# Patient Record
Sex: Female | Born: 1950 | Race: White | Hispanic: No
Health system: Midwestern US, Academic
[De-identification: ages and names within clinical notes are randomized; demographics above are authoritative.]

---

## 2020-08-18 ENCOUNTER — Encounter: Admit: 2020-08-18 | Discharge: 2020-08-18

## 2020-08-18 NOTE — Telephone Encounter
Called and spoke w/Anita @ referring Dr. Lavone Nian office. Informed her I'm calling regarding the referral form she sent over and asked if she can fax back the following that available to me directly: recent office note, labs, head imaging, neurology notes,referral, neurocog testing? She states she'll look in chart and see what's available and fax back.

## 2020-08-24 ENCOUNTER — Encounter: Admit: 2020-08-24 | Discharge: 2020-08-24 | Payer: MEDICARE

## 2020-08-24 DIAGNOSIS — G3184 Mild cognitive impairment, so stated: Secondary | ICD-10-CM

## 2020-08-25 ENCOUNTER — Encounter: Admit: 2020-08-25 | Discharge: 2020-08-25 | Payer: MEDICARE

## 2020-08-25 DIAGNOSIS — R413 Other amnesia: Secondary | ICD-10-CM

## 2020-08-26 ENCOUNTER — Encounter: Admit: 2020-08-26 | Discharge: 2020-08-26 | Payer: MEDICARE

## 2020-09-05 ENCOUNTER — Encounter: Admit: 2020-09-05 | Discharge: 2020-09-05 | Payer: MEDICARE

## 2020-09-11 ENCOUNTER — Encounter: Admit: 2020-09-11 | Discharge: 2020-09-11 | Payer: MEDICARE

## 2020-09-11 ENCOUNTER — Ambulatory Visit: Admit: 2020-09-11 | Discharge: 2020-09-11 | Payer: MEDICARE

## 2020-09-11 DIAGNOSIS — G3184 Mild cognitive impairment, so stated: Secondary | ICD-10-CM

## 2020-09-12 ENCOUNTER — Encounter: Admit: 2020-09-12 | Discharge: 2020-09-12 | Payer: MEDICARE

## 2020-09-12 NOTE — Progress Notes
Brain MRI reviewed. I would not there is bilateral hippocampal atrophy, in addition the main findings of:    1.  Mild general cerebral volume loss, likely age-appropriate. No obvious pattern specific volume loss.  2.  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 Subtle manifestations of amyloid angiopathy could also be present.    The small hemosiderin depositions could raise a concern for vascular amyloid. The next step will be to review the results of her LP.

## 2020-09-15 ENCOUNTER — Encounter: Admit: 2020-09-15 | Discharge: 2020-09-15 | Payer: MEDICARE

## 2020-09-15 NOTE — Progress Notes
Interventional Radiology Outpatient Scheduling Checklist      1.  Name of Procedure(s):   LP      2.  Date of Procedure:   09/23/2020      3.  Arrival Time:   1200      4.  Procedure Time:  1300      5.  Correct Procedural Room Assignment:  [facility] Room 1      6.  Blood Thinners Triaged and instructed per protocol: Y/N/NA:  NA  Confirmed accurate instructions sent to patient: Y/N:  NA       7.  Procedure Order Verified: Y/N:  Yes      9.  Patient instructed to have a driver: Y/N/NA:  Yes    10.  Patient instructed on NPO status: Y/N/NA:  NA  Confirmed accurate instructions sent to patient: Y/N:  Yes    11.  Specimen needed: Y/N/NA:  Yes   Verified Order placed: Y/N:  Yes    12.  Allergies Verified:  Y/N:  Yes    13.  Is there an Iodine Allergy: Y/N:  No  Does the Procedure Require contrast: Y/N:   NO  If so, was the IR- Contrast Allergy Pre-Procedure Medication protocol ordered: Y/NA:  NA    14.  Does the patient have labs according to IR Pre-procedure Laboratory Parameter policy: Y/N/NA:  Yes  If No, was the patient instructed to obtain labs prior to procedure: Y/N/NA:  NA     15.  Will the patient need to be admitted or have a possible admission: Y/N:  No  If yes, confirmed accurate instructions sent to patient: Y/N/NA:  NA     16.  Patient States Understanding: Y/N:  Yes    17.  History of OSA:  Y/N:  No  If yes, confirm request to bring CPAP sent to patient: Y/N/NA:  NA    18. Does the patient have an insulin pump or continuous glucose monitor? Y/N:  NO   If yes, was the patient instructed that this will need to be removed for this procedure and to bring supplies to reapply once the procedure is complete? Y/N    19. Patient was sent electronic procedure instructions: Y/N:  Yes; email

## 2020-09-15 NOTE — Patient Education
Dear Ms. Katrinka Blazing,     Thank you for choosing [facility] Interventional Radiology for your procedure. Your appointment information is listed below:    Appointment Date: 09/23/2020  Appointment Time: 1:00 PM  Arrival Time: 12:00 PM   Location:        ? [facility]: [address] [place], [place] [address]   Parking: available in the front of the building  Check-in at Admissions Desk in Lobby          INTERVENTIONAL RADIOLOGY  PRE-PROCEDURE INSTRUCTIONS LOCAL    You are scheduled for a procedure in Interventional Radiology.  Please follow these instructions and any direction from your Primary Care/Managing Physician.  If you have questions about your procedure or need to reschedule please call [phone].      Medication Instructions:  Continue scheduled medication.       Diet Instructions: Maintain regular diet with no restrictions.     Day of Exam Instructions:  1. Bathe or shower with an antibacterial soap prior to your appointment.  2. Bring a list of your current medications and the dosages.  3. Wear comfortable clothing and leave valuables at home.  4. Arrive (1) hour prior to your appointment.  This time will be spent registering, interviewing, assessing, educating, and preparing you for the test.   You will be with [place] anywhere from 30 minutes to 2 hours after your exam depending on your procedure.  5. Depending on the procedure and as instructed by the nurse a responsible adult may be required to drive you home (no Benedetto Goad, taxis or buses are allowed). If a driver is required and you do not have one  we will be unable to perform your procedure.   6. The nurse will give you discharge instructions about your care and activities after the procedure.

## 2020-09-23 ENCOUNTER — Ambulatory Visit: Admit: 2020-09-23 | Discharge: 2020-09-23 | Payer: MEDICARE

## 2020-09-23 ENCOUNTER — Encounter: Admit: 2020-09-23 | Discharge: 2020-09-23 | Payer: MEDICARE

## 2020-09-23 DIAGNOSIS — G3184 Mild cognitive impairment, so stated: Secondary | ICD-10-CM

## 2020-09-23 DIAGNOSIS — R413 Other amnesia: Secondary | ICD-10-CM

## 2020-09-23 DIAGNOSIS — E785 Hyperlipidemia, unspecified: Secondary | ICD-10-CM

## 2020-09-23 LAB — POC GLUCOSE: Lab: 118 mg/dL — ABNORMAL HIGH (ref 70–100)

## 2020-09-23 LAB — TOTAL PROTEIN-CSF: Lab: 48 mg/dL — ABNORMAL HIGH (ref 15–45)

## 2020-09-23 LAB — GLUCOSE-CSF: Lab: 67 mg/dL (ref 40–75)

## 2020-09-23 NOTE — Other
Immediate Post Procedure Note    Date:  09/23/2020                                         Attending Physician:   Robbie Lis    Procedure(s):  Lumbar puncture  Pre/Post Diagnosis:  Memory changes  Description/Findings:  Clear CSF  Anesthesia:  1% lidocaine    Time out performed: Consent obtained, correct patient verified, correct procedure verified, correct site verified, patient marked as necessary.  Estimated Blood Loss:  None/Negligible  Specimen(s) Removed/Disposition:  Yes, sent to pathology  Complications: None    Demetra Shiner, MD

## 2020-09-23 NOTE — H&P (View-Only)
Pre Procedure History and Physical/Sedation Plan    Procedure Date: 09/23/2020     Planned Procedure(s):  Lumbar puncture  Indication:  Memory change  Sedation/Medication Plan: Lidocaine  Discussion/Reviews:  Physician has discussed risks and alternatives of this type of sedation and above planned procedures with patient  Notes:  none  __________________________________________________________________    Active Problems:    * No active hospital problems. *      Chief Complaint:  Memory change    History of Present Illness: Deborah Johns is a 69 y.o. female with memory change    Previous Anesthetic/Sedation History:  Denies previous adverse events    Nursing Medical History     Nursing Surgical History     Pertinent medical/surgical history reviewed  Social History     Socioeconomic History    Marital status: Single     Spouse name: Not on file    Number of children: Not on file    Years of education: Not on file    Highest education level: Not on file   Occupational History    Not on file   Tobacco Use    Smoking status: Never Smoker    Smokeless tobacco: Never Used   Vaping Use    Vaping Use: Never used   Substance and Sexual Activity    Alcohol use: Not Currently    Drug use: Never    Sexual activity: Not on file   Other Topics Concern    Not on file   Social History Narrative    Not on file     Family history reviewed; non-contributory  Allergies:  Patient has no known allergies.  Medications:  No current facility-administered medications for this encounter.     Review of Systems:  Pertinent as noted per HPI    Physical Exam:     General:  Alert, cooperative, no distress, appears stated age    Airway:  airway assessment performed  Mallampati II (soft palate, uvula, fauces visible)  Anesthesia Classification:  ASA II (A normal patient with mild systemic disease)    Lab/Radiology/Other Diagnostic Tests:  Labs:  Pertinent labs reviewed  CXR:  Not obtained  Consults: Not obtained    Demetra Shiner, MD

## 2020-09-29 ENCOUNTER — Encounter: Admit: 2020-09-29 | Discharge: 2020-09-29 | Payer: MEDICARE

## 2020-09-29 NOTE — Progress Notes
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 MRI does not show evidence of altered flow problem and neither does exam.    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    These data can help inform cholinesterase inh therapy considerations.

## 2020-10-10 ENCOUNTER — Encounter: Admit: 2020-10-10 | Discharge: 2020-10-10 | Payer: MEDICARE

## 2020-10-10 MED ORDER — DONEPEZIL 5 MG PO TAB
5 mg | ORAL_TABLET | Freq: Every day | ORAL | 5 refills | 90.00000 days | Status: AC
Start: 2020-10-10 — End: ?

## 2020-10-10 NOTE — Progress Notes
Discussed results of LP with patient (see my previous doc note).     I presented it as her dx remains MCI, although the underlying basis for the MCI based on the abeta change and CNO:BSJGG ratio is likely going to be emerging AD.    Otherwise I quoted that for those dx'd with MCI, the prognosis is: in 5 years there is 50% chance the dx will still be MCI, and a 50% chance it will be AD. I recommended that in terms of long term planning, she take into account that down the line she may not be doing as well cognitively as she currently is.    We decided that given the patient's desire to reduce the chances of progressing at least over the short term, I will start her on donepezil 5 mg qhs and place a rx. I discussed side effects and that if he is tolerating it in february upon her f/u in [facility], we can consider increasing the dose to 10 mg.    Otherwise, it is relevant that her MRI shows hemosiderin deposition, which would preclude aduhelm. Also the fact that her tau is nl makes that decision even more complicated. Perhaps by February there will be more appropriate use perspective on this point, and if there is a consensus to reconsider aduhelm perhaps we can repeat the MRI.

## 2020-12-22 ENCOUNTER — Encounter

## 2020-12-22 DIAGNOSIS — R4189 Other symptoms and signs involving cognitive functions and awareness: Secondary | ICD-10-CM

## 2020-12-22 DIAGNOSIS — E785 Hyperlipidemia, unspecified: Secondary | ICD-10-CM

## 2020-12-22 DIAGNOSIS — R413 Other amnesia: Secondary | ICD-10-CM

## 2020-12-23 ENCOUNTER — Encounter

## 2020-12-23 DIAGNOSIS — R4189 Other symptoms and signs involving cognitive functions and awareness: Secondary | ICD-10-CM

## 2020-12-23 DIAGNOSIS — E785 Hyperlipidemia, unspecified: Secondary | ICD-10-CM

## 2020-12-23 DIAGNOSIS — R413 Other amnesia: Secondary | ICD-10-CM

## 2020-12-26 ENCOUNTER — Encounter

## 2020-12-26 DIAGNOSIS — R413 Other amnesia: Secondary | ICD-10-CM

## 2020-12-26 DIAGNOSIS — E785 Hyperlipidemia, unspecified: Secondary | ICD-10-CM

## 2020-12-26 DIAGNOSIS — R4189 Other symptoms and signs involving cognitive functions and awareness: Secondary | ICD-10-CM

## 2021-04-12 ENCOUNTER — Encounter: Admit: 2021-04-12 | Discharge: 2021-04-12 | Payer: MEDICARE

## 2021-04-13 ENCOUNTER — Encounter: Admit: 2021-04-13 | Discharge: 2021-04-13 | Payer: MEDICARE

## 2021-04-13 DIAGNOSIS — R4189 Other symptoms and signs involving cognitive functions and awareness: Secondary | ICD-10-CM

## 2021-04-13 DIAGNOSIS — G3184 Mild cognitive impairment, so stated: Secondary | ICD-10-CM

## 2021-04-13 DIAGNOSIS — E785 Hyperlipidemia, unspecified: Secondary | ICD-10-CM

## 2021-04-13 DIAGNOSIS — R413 Other amnesia: Secondary | ICD-10-CM

## 2021-04-13 MED ORDER — DONEPEZIL 5 MG PO TAB
5 mg | ORAL_TABLET | Freq: Every day | ORAL | 1 refills | 90.00000 days | Status: AC
Start: 2021-04-13 — End: ?

## 2021-04-13 NOTE — Progress Notes
MEMORY CARE CLINIC EVALUATION    Chief Complaint:  Deborah Johns is a 70 y.o. old female here for a follow-up cognitive evaluation.   History provided by:   Patient  Onset:  ~2017. Initially evaluated by Dr. Elon Jester on 08/24/2020.  Course:  Progressive    If you were to qualify, would you be interested in participating in clinical research? Yes    _______________________________________________________________________  _____________________HISTORY__________________________________________    History of Present Illness  LCV 12/22/2020 w this provider. Felt to have MCI. 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 Planned to continue donepezil 5 mg daily, but change timing to the morning or afternoon to see if this improves sleep.      Since last visit,on  her memory has been stable if not improved. She feels like she performed better on cognitive testing today. Sleep is improved with changing timing of donepezil. She wanted to screen for the [employer] study, but the cohorts that she may have qualified for filled before she could screen. She is interested in research participation, but no drug trials.    Recent changes in health or hospitalizations? No. Intentional weight loss with diet recommendations. She remains pretty active. Routine labs improved. Feels like she is doing better. She's having some neck pain 7/10 at times. Non-radiating. No weakness.     Functional Status  Living situation:  [place]   Dressing: Independent  Bathing: Independent  Toileting: Independent  Meal Preparation: Independent  Eating: Independent  Medication administration: Independent  Usual household chores: Independent  Managing finances: Independent  Activity: Retired Runner, broadcasting/film/video. 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     Behavioral and Neuropsychiatric History (i.e., depression, agitation, behavior / personality): PHQ-2 Score: 0 (04/13/2021  1:55 PM)    Depression: No  Anxiety: No  Agitation: No  Behavior/Personality changes: No  Visual hallucinations: No      Review of Systems  Parkinsonism / tremors: No  Seizures: No  Dizziness: No  Headaches: No  Gait Changes: No  Falls: No  Vision problems: No  Speech changes: No  Hearing loss: No  Wt loss: No  Swallowing difficulties: No  Sleep issues:  No loud snoring or REM BD. Sleep has improved with changing timing of donepezil and PRN extended release melatonin.   Incontinence: No  All other ROS are negative    Medical History:   Diagnosis Date   ? Disorganized thinking    ? Hyperlipidemia    ? Memory change        No past surgical history on file.    Family History   Problem Relation Age of Onset   ? Dementia Father    ? Dementia Paternal Grandmother    ? Dementia Paternal Uncle         Social History     Tobacco Use   ? Smoking status: Never Smoker   ? Smokeless tobacco: Never Used   Vaping Use   ? Vaping Use: Never used   Substance Use Topics   ? Alcohol use: Not Currently   ? Drug use: Never       Social History     Social History Narrative    Retired Therapist, [employer] [place] and Social Support:  Daughter is supportive. [place] out of [place].      Safety Assessment:  Driving? Yes     Disorientation when driving? No     Recent  traffic violations or accidents? No     Passenger concerns? No  Safety concerns in home? No  Wandering? No  Accessibility to firearms? No    Advanced Care Planning  DPOA:  Not on file  Advance Directive?  Not on file    Labs / Imaging  No results found for: TSH  No results found for: VITB12    MRI Brain (09/11/2020) reviewed by Dr. Elon Jester  Brain MRI reviewed. I would not there is bilateral hippocampal atrophy, in addition the main findings of:  ?  1. ?Mild general cerebral volume loss, likely age-appropriate. No obvious pattern specific volume loss.  2. 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 Subtle manifestations of amyloid angiopathy could also be present.  ?  The small hemosiderin depositions could raise a concern for vascular amyloid. The next step will be to review the results of her LP.    CSF results reviewed:  AB42 reduced  TTau, PTau nl  Ttau/AB42 ratio increased  ?  Results c/w evolving AD vs potentially d/o of fluid dynamics. MRI does not show evidence of altered flow problem and neither does exam.  ?  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  ?  These data can help inform cholinesterase inh therapy considerations  ____________________________________________________________________  ____________________MEDICATIONS_____________________________________  ? desloratadine (CLARINEX) 5 mg tablet Take 5 mg by mouth daily.   ? docosahexaenoic acid/epa (FISH OIL PO) Take 1,000 mg by mouth daily.   ? donepeziL (ARICEPT) 5 mg tablet Take one tablet by mouth daily.   ? ergocalciferol (vitamin D2) (VITAMIN D PO) Take 2,000 mg by mouth daily.   ? estradioL (ESTRACE) 0.5 mg tablet Take 0.5 mg by mouth daily.   ? fluocinonide (LIDEX) 0.05 % topical ointment Apply  topically to affected area daily.   ? MAGNESIUM PO Take 500 mg by mouth daily.   ? multivit with calcium,iron,min [facility] MULTIPLE VITAMINS PO) Take  by mouth daily.   ? naproxen sodium (ALEVE PO) Take  by mouth daily. Takes 2 tablets daily.  Patient unsure of strength as OTC   ? other medication 1 Dose. Medication Name & Strength: Vitamin B12    Dose(how many): unknown    Frequency(how often): daily   ? other medication 1 Dose. Medication Name & Strength: Vitamin B3    Dose(how many): unknown    Frequency(how often): daily   ? other medication 1 Dose. Medroxyprogesterone Acetate .5 mg Takes 1 tablet daily   ? other medication 1 Dose. Women's Calcium Takes 2 tablets daily.  Patient unsure of strength as OTC   ? other medication 1 Dose. Osteobiflex Takes 1 tablet daily Patient unure of strength as OTC   ? rosuvastatin (CRESTOR) 5 mg tablet Take 5 mg by mouth every 48 hours. Takes 1/2 tablet q.o.d.   ? tretinoin (RETIN-A) 0.025 % topical cream Apply  topically to affected area at bedtime daily.   ? vit C/E/zinc ox/copp/lut/zeax (ICAPS AREDS2 PO) Take  by mouth daily. One tablet daily   ? vitamin E acetate (VITAMIN E PO) Take 2,000 mg by mouth daily.       Medication Review:   I have personally reviewed the medication list and am making recommendations as listed in the plan below.   The patient manages medication at home for Semika Zou.   The following medications have been identified as potential  high risk medications: none  _____________________________________________________________________________  ___________________PHYSICAL AND COGNITIVE EXAM______________________  Vital Signs: BP (!) 146/83  - Pulse 70  - Ht 162.6 cm (5' 4)  - Wt 60.7 kg (133 lb 12.8 oz)  - LMP  (LMP Unknown)  - SpO2 98%  - BMI 22.97 kg/m?     Mental Status  Alert, NAD, WDWN.   Speech fluent and comprehension intact  Affect congruent with mood  Thought process unremarkable  Insight appears intact    Cranial Nerves  Extraocular movements intact  Pupils equal and reactive to light  No facial asymmetry  Tongue and palate midline  All other CNs normal    Motor  Full strength in the upper and lower extremities, no cogwheel rigidity  Normal rapid alternating hand movement  No tremor    Reflexes  Symmetric, no evidence of hyperreflexia in the biceps, brachioradialis and knees    Sensation  Intact to light touch throughout    Coordination  Not assessed    Gait  Normal stance  Normal casual gait with normal arm swing, stride length and turns    NEUROBEHAVIORAL TESTING    Short Test of Mental Status    Orientation: 8/8    Attention: 7/7    Immediate Recall: 4/4 (number of trials: , **)    Calculations: 3/4    Abstraction: 3/3    Cube: 1/2    Clock: 2/2    Information: 4/4    Delayed Recall: 4/4    Total Score: 36/38. Previous score 35/38     __________________________________________________________________  _______________DIAGNOSIS AND PLAN_______________________________    DIAGNOSIS:  Problem   Mild Cognitive Impairment    12/2020. 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 STMS today 35/38. On donepezil for the past 2 months. Tolerating well. Mild sleep disturbance. Will change dosing to morning. Also recommended melatonin. Further discussed aducanamab. Will continue to defer. Per Dr. Elon Jester, consider repeat MRI given hemosiderin deposition. Also discussed research. Referral made to the [place] [facility].     04/2021: STMS 36/38. Subjective cognitive stability. Improvement in sleep with the above recommendations. Will continue donepezil 5 mg daily.        FAST Stage: 2. Indicating possible MCI and CDR: 0-0.5. No family or friend at appointment to corroborate.      CARE PLAN  Further Evaluation  ? No new testing today.      Cognitive therapy plan  ? Continue Aricept (donepezil) 5 mg by mouth daily.   ? Continue using melatonin time released as needed.    Neuropsychiatric therapy  ?  No symptoms at this time. We can continue to monitor. Unrecognized and untreated mood symptoms can also contribute to cognitive impairment.     Lifestyle Recommendations  ? Encouraged to stay active mentally, physically, and socially  ? Encouraged heart healthy diet  ? Driving: no recommendations.     Referrals  ? Research: She is interested in imaging and observational studies. Will re-refer to research.   ? General educational materials provided    Follow-up  ? Return as scheduled with Dr. Elon Jester.       Total Time Today was 40 minutes in the following activities: Preparing to see the patient, Obtaining and/or reviewing separately obtained history, Performing a medically appropriate examination and/or evaluation, Counseling and educating the patient/family/caregiver, Referring and communication with other health care professionals (when not separately reported), Documenting clinical information in the electronic or other health record and Independently interpreting results (not separately  reported) and communicating results to the patient/family/caregiver    Gwenlyn Found, APRN-NP

## 2021-04-13 NOTE — Patient Instructions
1. Continue donepezil 5 mg daily. New 90-day rx sent to [facility] pharmacy.    2. Today's memory test score was 36/38. Previous was 35/38.    3. I will refer you back to research for observational studies that may be able to perform a PET scan and disclose results.

## 2021-09-13 ENCOUNTER — Encounter: Admit: 2021-09-13 | Discharge: 2021-09-13 | Payer: MEDICARE

## 2021-12-25 ENCOUNTER — Encounter: Admit: 2021-12-25 | Discharge: 2021-12-25 | Payer: MEDICARE

## 2021-12-25 MED ORDER — DONEPEZIL 5 MG PO TAB
5 mg | ORAL_TABLET | Freq: Every day | ORAL | 0 refills | 90.00000 days | Status: AC
Start: 2021-12-25 — End: ?

## 2021-12-25 NOTE — Telephone Encounter
LV 04/13/21, no next visit scheduled. Per care plan- Continue Aricept (donepezil) 5 mg by mouth daily.  Noted on prescription that future refills require and appointment.  This nurse escribed 90 tablets with no refills to  [facility] pharmacy in [place], [place].

## 2022-03-22 ENCOUNTER — Encounter: Admit: 2022-03-22 | Discharge: 2022-03-22 | Payer: MEDICARE

## 2022-03-22 MED ORDER — DONEPEZIL 5 MG PO TAB
ORAL_TABLET | ORAL | 0 refills | 90.00000 days | Status: AC
Start: 2022-03-22 — End: ?

## 2022-03-22 NOTE — Telephone Encounter
LV 05/03/21, no next visit scheduled- Per care plan- Continue Aricept (donepezil) 5 mg by mouth daily.   Needs appointment for future refills placed on med label.  This nurse escribed 30 tablets no refills. To [facility] in [place].

## 2022-04-25 ENCOUNTER — Encounter: Admit: 2022-04-25 | Discharge: 2022-04-25 | Payer: MEDICARE

## 2022-04-25 MED ORDER — DONEPEZIL 5 MG PO TAB
ORAL_TABLET | 0 refills
Start: 2022-04-25 — End: ?

## 2022-05-01 ENCOUNTER — Encounter: Admit: 2022-05-01 | Discharge: 2022-05-01 | Payer: MEDICARE

## 2022-05-01 MED ORDER — DONEPEZIL 5 MG PO TAB
ORAL_TABLET | 0 refills
Start: 2022-05-01 — End: ?

## 2022-05-01 NOTE — Telephone Encounter
Refilled as courtesy refill in May for 30 days only.

## 2022-05-03 ENCOUNTER — Encounter: Admit: 2022-05-03 | Discharge: 2022-05-03 | Payer: MEDICARE

## 2022-05-03 MED ORDER — DONEPEZIL 5 MG PO TAB
5 mg | ORAL_TABLET | Freq: Every day | ORAL | 1 refills | 90.00000 days | Status: AC
Start: 2022-05-03 — End: ?

## 2022-05-18 NOTE — Progress Notes
MEMORY CARE CLINIC EVALUATION    Chief Complaint:  Deborah Johns is a 71 y.o. old female here for a follow-up cognitive evaluation.   Additional history provided by:  Patient (alone)  Onset:  ~2017.  Course:  progressive    HPI:  Since last visit on 04/13/21, with Deborah Johns her cognition has remained stable. She is making more notes and lists. She uses a calendar to keep up with appointments and has no trouble doing so. She has some difficulty remembering names and doesn't feel as sharp. No trouble with ADLs or driving. She helps take care of a friend whose wife passed from dementia and is himself suffering from leukemia. She stays with him for one week at a time, and helps manage and drive him to appointments. She notes this has been an extra burden on her on top of her numerous health changes in the last year.    Since her last visit, she was diagnosed with left breast cancer after a screening mammography on 05/05/2021. She was previously taking hormone replacement therapy with estradiol?and progesterone. ?She had lumpectomy and sentinel lymph node biopsy performed on?06/07/2021. No chemotherapy was recommended. She did have adjunct radiation (07/19/2021-07/25/2021) and started on aromtase inhibitors 08/2021. Initially on Anastrazole, due was having aching and difficulty sleeping. She is now on Tamoxifen as of 2 weeks ago with improvement in symptoms. She states she can now follow with oncology on a yearly basis. She also sees Deborah Johns for gyncecolosy, due to fam hx of cervical cancer. She reports that she had a hysterectomy in December 2022. No problem with anesthesia.     Function  Living situation:  [place] Dressing: Independent  Bathing: Independent  Toileting: Independent  Meal Preparation: Independent  Eating: Independent  Medication administration: Independent  Usual household chores: Independent  Managing finances: Independent  Activity: Retired Runner, broadcasting/film/video. 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     Behavioral and Neuropsychiatric History (i.e., depression, agitation, behavior / personality):   PHQ-2 Score: 0 (05/21/2022  9:47 AM)    No data recorded    Depression: No  Anxiety: No  Agitation/Behavior/Personality changes: No  Visual hallucinations: No      ROS:  Parkinsonism / tremors: No  Gait Changes: No  Falls: No  Vision/Hearing changes: No  Speech changes: No  Unintentional Wt loss: No  Swallowing difficulties: No  Sleep issues: No loud snoring or REM BD. Sleep has improved after her aching has resolved after switching cancer medications, and taking Donepezil in the morning. Not needing melatonin at this time.   B/B Incontinence: No    Family Hx: Father had memory issues, but no official diagnosis. Paternal grandmother, paternal great grandmother, paternal uncle with dementia.    Socia Hx: Retired Runner, broadcasting/film/video. Widow. One daughter in [place] who is supportive.     Medical History:   Diagnosis Date   ? Disorganized thinking    ? Hyperlipidemia    ? Memory change        Family History   Problem Relation Age of Onset   ? Dementia Father    ? Dementia Paternal Grandmother    ? Dementia Paternal Uncle        Social History     Socioeconomic History   ? Marital status: Widowed   Tobacco Use   ? Smoking status: Never   ? Smokeless tobacco: Never   Vaping Use   ? Vaping Use: Never used  Substance and Sexual Activity   ? Alcohol use: Not Currently   ? Drug use: Never   Social History Narrative    Retired Optometrist Burden and Social Support:  Deborah Johns does not rely on care giving. Daughter is supportive. [place] of [place].   Care partner burden expressed? No    Safety Assessment:  Driving? Yes     Disorientation when driving? No     Recent traffic violations or accidents? No     Passenger concerns? No  Safety concerns in home? No   Wandering? No    Advanced Care Planning  DPOA:  Yes daughter  Advance Directive: Yes    Medications:  ? CHOLEcalciferoL (vitamin D3) 1,000 units tablet Take one tablet by mouth daily with breakfast. (2,000 units)   ? desloratadine (CLARINEX) 5 mg tablet Take one tablet by mouth daily with breakfast.   ? docosahexaenoic acid/epa (FISH OIL PO) Take 1,200 mg by mouth daily with breakfast.   ? donepeziL (ARICEPT) 5 mg tablet Take one tablet by mouth daily after breakfast.   ? ergocalciferol (vitamin D2) (VITAMIN D PO) Take 2,000 mg by mouth daily.   ? estradioL (ESTRACE) 0.5 mg tablet Take one tablet by mouth daily.   ? fish oil /omega-3 fatty acids 1,600-500-800 mg/5 mL Take 3.125 mL by mouth daily with breakfast.   ? fluocinonide (LIDEX) 0.05 % topical ointment Apply  topically to affected area daily.   ? Glucosamine-Chondroitin 250-200 mg tab Take 1 tablet by mouth daily with breakfast. Osteo Bi-Flex   ? MAGNESIUM PO Take 500 mg by mouth daily with breakfast.   ? multivit with calcium,iron,min [facility] MULTIPLE VITAMINS PO) Take 2 tablets by mouth daily with breakfast. Vitafusion Women's MVI gummies   ? naproxen sodium (ALEVE PO) Take  by mouth daily. Takes 2 tablets daily.  Patient unsure of strength as OTC   ? other medication one Dose. Vitafusion calcium gummies Calcium - Cholecalciferol 600 mg - 10 mcg (400 unit) Takes 2 tablets with breakfast   ? other medication one Dose. Medication Name & Strength: Vitamin B12    Dose(how many): unknown    Frequency(how often): daily   ? other medication one Dose. Medication Name & Strength: Vitamin B3    Dose(how many): unknown    Frequency(how often): daily   ? other medication one Dose. Medroxyprogesterone Acetate .5 mg Takes 1 tablet daily   ? other medication one Dose. Women's Calcium Takes 2 tablets daily.  Patient unsure of strength as OTC   ? other medication one Dose. Osteobiflex Takes 1 tablet daily Patient unure of strength as OTC   ? rosuvastatin (CRESTOR) 5 mg tablet Take one tablet by mouth every 48 hours. Takes 1/2 tablet q.o.d.   ? tamoxifen (NOLVADEX) 20 mg tablet Take one tablet by mouth at bedtime daily.   ? tretinoin (RETIN-A) 0.025 % topical cream Apply  topically to affected area at bedtime daily.   ? vit C/E/zinc ox/copp/lut/zeax (ICAPS AREDS2 PO) Take 1 capsule by mouth daily with breakfast. (Vitamin C, E, Zn-Copper - Lutein - Zeaxan 250 mg-90 mg-40 mg 1 mg)   ? vitamin E acetate (VITAMIN E PO) Take 2,000 mg by mouth daily.   ? vitamins, B complex tablet Take one tablet by mouth daily with breakfast.     Medication Review:   I have personally reviewed the medication list and am making recommendations as listed in the plan below.   Rim manages  medication at home for Reece Packer.   The following medications have been identified as potential high risk medications: none    Physical/Cognitive Exam  BP 122/78 (BP Source: Arm, Left Upper, Patient Position: Sitting)  - Pulse 82  - Temp 36.6 ?C (97.8 ?F) (Oral)  - Resp 12  - Ht 162.6 cm (5' 4)  - Wt 62.2 kg (137 lb 3.2 oz)  - LMP  (LMP Unknown)  - SpO2 96%  - BMI 23.55 kg/m?   BP Readings from Last 3 Encounters:   04/13/21 (!) 146/83   12/22/20 (!) 166/91   09/23/20 139/81     Wt Readings from Last 6 Encounters:   04/13/21 60.7 kg (133 lb 12.8 oz)   12/22/20 63 kg (139 lb)   08/24/20 61.5 kg (135 lb 9.6 oz)     General  Groomed with appropriate hygiene. Dress appropriate for season/situation.    HEENT  NC/AT    Cardio/Pulm  Non labored at rest, good effort  RR to radial palpation    Mental Status  Alert, scored 8/8 for orientation on STMS  Language fluent    Cranial Nerves  Extraocular movements intact  Pupils equal and reactive to light  No facial asymmetry  Shoulder shrug symmetrical  Tongue and palate midline  All other CNs normal    Motor  Full strength in the upper and lower extremities  Rigidity: none  Bradykinesia: none  Tremor: none    Gait  Rising from chair: Normal - without pushing off  Posture: Normal  Gait initiation: Normal  Stride Length: Normal  Arm Swing: Normal  Turns: Normal    Psych  Affect and mood congruent and appropriate to situation  Thought process is logical    Neurobehavioral Testing:    12/2020 [facility] 35/38   04/2021 STMS 36/38    04/2022 Short Test of Mental Status  Orientation: 8/8  Attention: 6/7  Immediate Recall: 4/4 (number of trials: 1, -0)  Calculations: 4/4  Abstraction: 2/3  Cube: 2/2  Clock: 2/2  Information: 4/4  Delayed Recall: 1/4  Total Score: 33/38     Labs/Imaging:  No results found for: TSH  No results found for: VITB12     MRI Brain (09/11/2020) reviewed by Dr. Elon Jester  Brain MRI reviewed. I would not there is bilateral hippocampal atrophy, in addition the main findings of:  1. ?Mild general cerebral volume loss, likely age-appropriate. No obvious pattern specific volume loss.  2. 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 Subtle manifestations of amyloid angiopathy could also be present.  ?  The small hemosiderin depositions could raise a concern for vascular amyloid. The next step will be to review the results of her LP.  ?  CSF results reviewed:  AB42 reduced  TTau, PTau nl  Ttau/AB42 ratio increased  ?  Results c/w evolving AD vs potentially d/o of fluid dynamics. MRI does not show evidence of altered flow problem and neither does exam.  ?  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    Diagnosis and Plan       1. 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 STMS today 33/38. Patient is independent with all ADLS. She is on 5mg  of Donepezil, has  not tried to take 10mg  dose in the past. She states she is sensitive with medications but is open to trying higher dose at next visit. She would like to defer increasing dose at this visit since she had a recent medication change with Tamoxifen and would like to give herself time to adjust on that medication first.    FAST score: 3. Mild Cognitive Impairment-Objective Functional deficit interferes with a per's most complex tasks    Decision Making  Patient does not appear to have remarkable difficulties with their medical decision-making capacity.    CARE PLAN  Further Evaluation  ? No new recommendations    Cognitive therapy plan  ? Continue Aricept (donepezil) 5mg  by mouth daily with food. We should consider increasing to 10 mg at next visit once you are feeling more comfortable on being on Tamoxifen since you have had recent medication changes.   ? No concerns for sleep apnea. You can use melatonin ER as needed if you feel the need for this again in the future.     Neuropsychiatric therapy  ? No medications needed at this time    Lifestyle Recommendations  ? Encouraged to stay active mentally, physically, and socially (reading, exercising, visiting with friends and family)  ? Encouraged heart healthy diet  ? Driving: No limitations    ? We would advise you to limit alcohol intake to 1 standard alcoholic beverage/day or less.  ? My Alliance: [email or web address]    Referrals  ? Cognitive Care Network:  Deferred today, has adequate support   ? Research:  Previously referred. She is interested in the future, but was told she was not eligible based on cancer management.   ? General educational materials provided on lifestyle interventions for memory care    Follow-up  ? Return to clinic in 6 months.    How to Keep Your Brain Healthy   1. Physical exercise: Physical exercise allows more blood flow to get to your brain.  All exercise is better than none, but gardening and walking are like tier 1 of exercise and will not get your heart rate higher than 100 in many instances. Weight training, yoga, and resistance bands are like tier 2 level. We see the real benefits at tier 3 level of exercise, which involves aerobic or cardiovascular exercise. Cardio exercise involves getting your heart rate up and a real sweat going.  This level of exercise can release BDNF (brain derived neurotrophic factor) which helps stabilize neurons - we think of it as fertilizer for those brain cells.  Research studies suggest symptoms progress slower and brain atrophy slows in cardio intensive exercise groups.  This can be done with high intensity workouts, running, biking, stationary biking, rowing machine, or in a swimming pool.  Talk to your health care provider before doing strenuous exercise. The ultimate goal is to get 150 minutes of moderate to intense exercise each week, most easily divided up into 30 minutes per day 5 days per week. It is recommended that one eases into this and slowly increases their amount of exercise over weeks to months. If you have never exercised before or are in poor exercise shape, it is recommended that you seek a personal trainer or join a local gym with programs directed for your age/exercise level.    2. Diet: Eating a heart-healthy diet may help protect brain function. Further information about the Mediterranean/MIND diet is listed below.  Talk to your primary medical provider about your specific diet needs.  3. Sleep: The toxic proteins that we all build up during the day and are associated with neurodegeneration of neurons are likely only cleared during deep, slow wave stages of sleep. If deep sleep is disrupted, fewer proteins are cleared. Sleep is also important for memory consolidation and restorative aspects to help with attention of tasks throughout the next day.  The average person requires 7-9 hours of quality sleep per night.  Try to maintain a sleep routine.  Avoid caffeine in the afternoon and heavy meals after 7 pm.  Talk to your local medical provider if you are having problems with sleeping (for example: insomnia, obstructive sleep apnea, excessive daytime sleepiness, or not sleeping enough).    4. Mental exercise: Push your brain with new things and new places.  Take a community education class, or attend a lecture at [employer]. Doing activities, such as reading, learning an instrument or a new language is healthy for our brains. Utilize cognitive training apps or programs. There is no evidence currently that one works better than other, but make sure not to focus on one thing (crosswords, Sudoku puzzles, computer-based games, etc.)  Make sure to have a broad cognitive program.    5. Stress Reduction: Simplify your life as much as possible. Meditate, do yoga or tai chi, or perform deep breathing, etc.  Stress will amplify any underlying problem.      6. Spiritual Fitness: Find something you are passionate about and that gives your life meaning and purpose each day.  Get involved.  Stay active in your local community and with social functions.    7. Getting involved in research: This can be empowering for many who are involved in research. It gives individuals a sense of purpose and fighting back against this disease. The first person who will be cured of a neurodegenerative disease will be from a research study and helping [place] move that science forward is a major contribution.    Mediterranean Diet   The Mediterranean Diet and the MIND diet are the best studied across neurodegenerative diseases.  Both have shown a reduced risk of Alzheimer's disease across multiple studies, ranging from 35-50% reduced risk.  The Mediterranean diet is considered to be a heart-healthy diet as well.  Interested in trying the Mediterranean diet? These tips will help you get started:  ? Eat more fruits and vegetables. Aim for 7 to 10 servings a day of fruit and vegetables.  ? Opt for whole grains. Switch to whole-grain bread, cereal and pasta. Experiment with other whole grains, such as bulgur and farro.  ? Use healthy fats. Try olive oil as a replacement for butter when cooking. Instead of putting butter or margarine on bread, try dipping it in flavored olive oil.  ? Eat more seafood. Eat fish twice a week. Fresh or water-packed tuna, salmon, trout, mackerel and herring are healthy choices. Grilled fish tastes good and requires little cleanup. Avoid deep-fried fish.  ? Reduce red meat. Substitute fish, poultry or beans for meat. If you eat meat, make sure it's lean and keep portions small.  ? Enjoy some dairy. Eat low-fat [place] or plain yogurt and small amounts of a variety of cheeses.  ? Spice it up. Herbs and spices boost flavor and lessen the need for salt.    Utilize a high rated book on the subject or a good Solicitor as well: https://www.helpguide.org/articles/diets/the-mediterranean-diet.htm    Other important information:  Scheduling: [phone]   Samora Jernberg or our nurses: [phone]  I am always available via mychart. Please feel free to send me any questions you might have, anytime.  After hours, you can call [phone], and have the neurology resident on call paged    Total of 45 minutes were spent on the same day of the visit including preparing to see the patient, obtaining and/or reviewing separately obtained history, performing a medically appropriate examination and/or evaluation, counseling and educating the patient/family/caregiver, ordering medications, tests, or procedures, referring and communication with other health care professionals, documenting clinical information in the electronic or other health record, independently interpreting results and communicating results to the patient/family/caregiver, and care coordination.

## 2022-05-21 ENCOUNTER — Encounter: Admit: 2022-05-21 | Discharge: 2022-05-21 | Payer: MEDICARE

## 2022-05-21 DIAGNOSIS — R4189 Other symptoms and signs involving cognitive functions and awareness: Secondary | ICD-10-CM

## 2022-05-21 DIAGNOSIS — E785 Hyperlipidemia, unspecified: Secondary | ICD-10-CM

## 2022-05-21 DIAGNOSIS — G3184 Mild cognitive impairment, so stated: Secondary | ICD-10-CM

## 2022-05-21 DIAGNOSIS — R413 Other amnesia: Secondary | ICD-10-CM

## 2022-05-21 MED ORDER — DONEPEZIL 5 MG PO TAB
5 mg | ORAL_TABLET | Freq: Every day | ORAL | 5 refills | 90.00000 days | Status: AC
Start: 2022-05-21 — End: ?

## 2022-05-21 NOTE — Patient Instructions
Further Evaluation  No new recommendations    Cognitive therapy plan  Continue Aricept (donepezil) 5mg  by mouth daily with food. We should consider increasing to 10 mg at next visit once you are feeling more comfortable on being on Tamoxifen for some time since you have had recent medication changes.   No concerns for sleep apnea. You can use melatonin time released as needed.    Neuropsychiatric therapy  No medications needed at this time    Lifestyle Recommendations  Encouraged to stay active mentally, physically, and socially (reading, exercising, visiting with friends and family)  Encouraged heart healthy diet  Driving: No limitations    We would advise you to limit alcohol intake to 1 standard alcoholic beverage/day or less.  My Alliance: [email or web address]    Referrals  Cognitive Care Network:  Deferred today, has adequate support   Research:  Previously referred. She is interested in the future, but not eligible based on cancer medications.  General educational materials provided on lifestyle interventions for memory care    Follow-up  Return to clinic in 6 months.    How to Keep Your Brain Healthy   Physical exercise: Physical exercise allows more blood flow to get to your brain.  All exercise is better than none, but gardening and walking are like tier 1 of exercise and will not get your heart rate higher than 100 in many instances. Weight training, yoga, and resistance bands are like tier 2 level. We see the real benefits at tier 3 level of exercise, which involves aerobic or cardiovascular exercise. Cardio exercise involves getting your heart rate up and a real sweat going.  This level of exercise can release BDNF (brain derived neurotrophic factor) which helps stabilize neurons - we think of it as fertilizer for those brain cells.  Research studies suggest symptoms progress slower and brain atrophy slows in cardio intensive exercise groups.  This can be done with high intensity workouts, running, biking, stationary biking, rowing machine, or in a swimming pool.  Talk to your health care provider before doing strenuous exercise. The ultimate goal is to get 150 minutes of moderate to intense exercise each week, most easily divided up into 30 minutes per day 5 days per week. It is recommended that one eases into this and slowly increases their amount of exercise over weeks to months. If you have never exercised before or are in poor exercise shape, it is recommended that you seek a personal trainer or join a local gym with programs directed for your age/exercise level.    Diet: Eating a heart-healthy diet may help protect brain function. Further information about the Mediterranean/MIND diet is listed below.  Talk to your primary medical provider about your specific diet needs.    Sleep: The toxic proteins that we all build up during the day and are associated with neurodegeneration of neurons are likely only cleared during deep, slow wave stages of sleep. If deep sleep is disrupted, fewer proteins are cleared. Sleep is also important for memory consolidation and restorative aspects to help with attention of tasks throughout the next day.  The average person requires 7-9 hours of quality sleep per night.  Try to maintain a sleep routine.  Avoid caffeine in the afternoon and heavy meals after 7 pm.  Talk to your local medical provider if you are having problems with sleeping (for example: insomnia, obstructive sleep apnea, excessive daytime sleepiness, or not sleeping enough).    Mental exercise: Push your brain with new things  and new places.  Take a community education class, or attend a lecture at [employer]. Doing activities, such as reading, learning an instrument or a new language is healthy for our brains. Utilize cognitive training apps or programs. There is no evidence currently that one works better than other, but make sure not to focus on one thing (crosswords, Sudoku puzzles, computer-based games, etc.)  Make sure to have a broad cognitive program.    Stress Reduction: Simplify your life as much as possible. Meditate, do yoga or tai chi, or perform deep breathing, etc.  Stress will amplify any underlying problem.      Spiritual Fitness: Find something you are passionate about and that gives your life meaning and purpose each day.  Get involved.  Stay active in your local community and with social functions.    Getting involved in research: This can be empowering for many who are involved in research. It gives individuals a sense of purpose and fighting back against this disease. The first person who will be cured of a neurodegenerative disease will be from a research study and helping [place] move that science forward is a major contribution.    Mediterranean Diet   The Mediterranean Diet and the MIND diet are the best studied across neurodegenerative diseases.  Both have shown a reduced risk of Alzheimer's disease across multiple studies, ranging from 35-50% reduced risk.  The Mediterranean diet is considered to be a heart-healthy diet as well.  Interested in trying the Mediterranean diet? These tips will help you get started:  Eat more fruits and vegetables. Aim for 7 to 10 servings a day of fruit and vegetables.  Opt for whole grains. Switch to whole-grain bread, cereal and pasta. Experiment with other whole grains, such as bulgur and farro.  Use healthy fats. Try olive oil as a replacement for butter when cooking. Instead of putting butter or margarine on bread, try dipping it in flavored olive oil.  Eat more seafood. Eat fish twice a week. Fresh or water-packed tuna, salmon, trout, mackerel and herring are healthy choices. Grilled fish tastes good and requires little cleanup. Avoid deep-fried fish.  Reduce red meat. Substitute fish, poultry or beans for meat. If you eat meat, make sure it's lean and keep portions small.  Enjoy some dairy. Eat low-fat [place] or plain yogurt and small amounts of a variety of cheeses.  Spice it up. Herbs and spices boost flavor and lessen the need for salt.    Utilize a high rated book on the subject or a good Solicitor as well: https://www.helpguide.org/articles/diets/the-mediterranean-diet.htm

## 2022-07-31 ENCOUNTER — Encounter: Admit: 2022-07-31 | Discharge: 2022-07-31 | Payer: MEDICARE

## 2022-10-02 ENCOUNTER — Encounter: Admit: 2022-10-02 | Discharge: 2022-10-02 | Payer: MEDICARE

## 2022-10-02 DIAGNOSIS — R413 Other amnesia: Secondary | ICD-10-CM

## 2022-10-02 DIAGNOSIS — G3184 Mild cognitive impairment, so stated: Secondary | ICD-10-CM

## 2022-10-02 DIAGNOSIS — E785 Hyperlipidemia, unspecified: Secondary | ICD-10-CM

## 2022-10-02 DIAGNOSIS — R4189 Other symptoms and signs involving cognitive functions and awareness: Secondary | ICD-10-CM

## 2022-10-02 NOTE — Progress Notes
MEMORY CARE CLINIC EVALUATION    Chief Complaint:  Deborah Johns is a 71 y.o. old female here for a follow-up cognitive evaluation.   Additional history provided by:  Patient (alone)  Onset:  ~2017.  Course:  progressive    HPI:  Since last visit on 05/21/22, with myself her cognition has remained stable. She notes increased stress. She continues to help a friend whose wife passed from dementia and is diagnosed with leukemia. She helps manage and drive him to his many appointments. She uses a calendar to keep up with appointments and has no trouble doing so. She also manages a large farm alone. She has some difficulty remembering names and doesn't feel as sharp. No trouble with ADLs or driving.    Diagnosed with left breast cancer after a screening mammography on 05/05/2021. She was previously taking hormone replacement therapy with estradiol?and progesterone. ?She had lumpectomy and sentinel lymph node biopsy performed on?06/07/2021. No chemotherapy was recommended. She did have adjunct radiation (07/19/2021-07/25/2021) and started on aromtase inhibitors 08/2021. Initially on Anastrazole, due was having aching and difficulty sleeping. She is now on Tamoxifen, follows with oncology on a yearly basis. Notes that she is sleeping better and having less stiffness of the hands on Tamoxifen. Wears wrist splints as needed.    Function  Living situation:  [place]   Dressing: Independent  Bathing: Independent  Toileting: Independent  Meal Preparation: Independent  Eating: Independent  Medication administration: Independent  Usual household chores: Independent  Managing finances: Independent  Activity: Retired Runner, broadcasting/film/video. 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     Behavioral and Neuropsychiatric History (i.e., depression, agitation, behavior / personality):   PHQ-2 Score: 0 (05/21/2022 10:24 AM)    No data recorded    Depression: No  Anxiety: No. Just overwhelmed.   Agitation/Behavior/Personality changes: No  Visual hallucinations: No      ROS:  Parkinsonism / tremors: No  Gait Changes: No  Falls: No  Vision/Hearing changes: No  Speech changes: No  Unintentional Wt loss: No  Swallowing difficulties: No  Sleep issues: No loud snoring or REM BD. Sleep has improved after her aching has resolved after switching cancer medication to Tamoxifen, and taking Donepezil in the morning. Not needing melatonin at this time but has available if needed.   B/B Incontinence: No  Other: Sees Dr. Revonda Humphrey for gynecology, due to fam hx of cervical cancer. She reports that she had a hysterectomy in December 2022.    Family Hx: Father had memory issues, but no official diagnosis. Paternal grandmother, paternal great grandmother, paternal uncle with dementia.    Socia Hx: Retired Runner, broadcasting/film/video. Widow. One daughter in [place] who is supportive.     Medical History:   Diagnosis Date   ? Disorganized thinking    ? Hyperlipidemia    ? Memory change        Family History   Problem Relation Age of Onset   ? Dementia Father    ? Dementia Paternal Grandmother    ? Dementia Paternal Uncle        Social History     Socioeconomic History   ? Marital status: Widowed   Tobacco Use   ? Smoking status: Never     Passive exposure: Past (Parents and grandparents)   ? Smokeless tobacco: Never   Vaping Use   ? Vaping Use: Never used   Substance and Sexual Activity   ? Alcohol use: Not  Currently   ? Drug use: Never   Social History Narrative    Retired Optometrist Burden and Social Support:  Deborah Johns does not rely on care giving. Daughter is supportive. Lives out of state in [place].   Care partner burden expressed? No    Safety Assessment:  Driving? Yes     Disorientation when driving? No     Recent traffic violations or accidents? No     Passenger concerns? No  Safety concerns in home? No   Wandering? No    Advanced Care Planning  DPOA:  Yes daughter  Advance Directive: Yes    Medications:  ? CHOLEcalciferoL (vitamin D3) 1,000 units tablet Take one tablet by mouth daily with breakfast. (2,000 units)   ? desloratadine (CLARINEX) 5 mg tablet Take one tablet by mouth daily with breakfast.   ? docosahexaenoic acid/epa (FISH OIL PO) Take 1,200 mg by mouth daily with breakfast.   ? donepeziL (ARICEPT) 5 mg tablet Take one tablet by mouth daily after breakfast.   ? ergocalciferol (vitamin D2) (VITAMIN D PO) Take 2,000 mg by mouth daily.   ? estradioL (ESTRACE) 0.5 mg tablet Take one tablet by mouth daily.   ? fish oil /omega-3 fatty acids 1,600-500-800 mg/5 mL Take 3.125 mL by mouth daily with breakfast.   ? fluocinonide (LIDEX) 0.05 % topical ointment Apply  topically to affected area daily.   ? Glucosamine-Chondroitin 250-200 mg tab Take 1 tablet by mouth daily with breakfast. Osteo Bi-Flex   ? MAGNESIUM PO Take 500 mg by mouth daily with breakfast.   ? multivit with calcium,iron,min [facility] MULTIPLE VITAMINS PO) Take 2 tablets by mouth daily with breakfast. Vitafusion Women's MVI gummies   ? naproxen sodium (ALEVE PO) Take  by mouth daily. Takes 2 tablets daily.  Patient unsure of strength as OTC   ? other medication one Dose. Vitafusion calcium gummies Calcium - Cholecalciferol 600 mg - 10 mcg (400 unit) Takes 1 tablet with breakfast   ? other medication one Dose. Medication Name & Strength: Vitamin B12    Dose(how many): unknown    Frequency(how often): daily   ? other medication one Dose. Medication Name & Strength: Vitamin B3    Dose(how many): unknown    Frequency(how often): daily   ? other medication one Dose. Medroxyprogesterone Acetate .5 mg Takes 1 tablet daily   ? other medication one Dose. Women's Calcium Takes 1 tablet daily.  Patient unsure of strength as OTC   ? other medication one Dose. Osteobiflex Takes 1 tablet daily Patient unure of strength as OTC   ? rosuvastatin (CRESTOR) 5 mg tablet Take one tablet by mouth every 48 hours. Takes 1/2 tablet q.o.d.   ? tamoxifen (NOLVADEX) 20 mg tablet Take one tablet by mouth at bedtime daily.   ? tretinoin (RETIN-A) 0.025 % topical cream Apply  topically to affected area at bedtime daily.   ? vit C/E/zinc ox/copp/lut/zeax (ICAPS AREDS2 PO) Take 1 capsule by mouth daily with breakfast. (Vitamin C, E, Zn-Copper - Lutein - Zeaxan 250 mg-90 mg-40 mg 1 mg)   ? vitamin E acetate (VITAMIN E PO) Take 2,000 mg by mouth daily.   ? vitamins, B complex tablet Take one tablet by mouth daily with breakfast.     Medication Review:   I have personally reviewed the medication list and am making recommendations as listed in the plan below.   Samyra manages medication at home for [employer].  The following medications have been identified as potential high risk medications: none    Physical/Cognitive Exam  BP 121/80 (BP Source: Arm, Left Upper, Patient Position: Sitting)  - Pulse 68  - Temp 36.7 ?C (98 ?F) (Oral)  - Resp 12  - Ht 162.6 cm (5' 4)  - Wt 62 kg (136 lb 9.6 oz)  - LMP  (LMP Unknown)  - SpO2 97%  - BMI 23.45 kg/m?   BP Readings from Last 3 Encounters:   05/21/22 122/78   04/13/21 (!) 146/83   12/22/20 (!) 166/91     Wt Readings from Last 6 Encounters:   05/21/22 62.2 kg (137 lb 3.2 oz)   04/13/21 60.7 kg (133 lb 12.8 oz)   12/22/20 63 kg (139 lb)   08/24/20 61.5 kg (135 lb 9.6 oz)     General  Groomed with appropriate hygiene. Dress appropriate for season/situation.    HEENT  NC/AT    Cardio/Pulm  Non labored at rest, good effort  RR to radial palpation    Mental Status  Alert, scored 8/8 for orientation on STMS  Language fluent    Cranial Nerves  Extraocular movements intact  Pupils equal and reactive to light  No facial asymmetry  Shoulder shrug symmetrical  Tongue and palate midline  All other CNs normal    Motor  Full strength in the upper and lower extremities  Rigidity: none  Bradykinesia: none  Tremor: none    Gait  Rising from chair: Normal - without pushing off  Posture: Normal  Gait initiation: Normal  Stride Length: Normal  Arm Swing: Normal  Turns: Normal    Psych  Affect and mood congruent and appropriate to situation  Thought process is logical    Neurobehavioral Testing:    12/2020 [facility] 35/38   04/2021 STMS 36/38  04/2022 STMS 33/38     09/2022 Short Test of Mental Status  Orientation: 8/8  Attention: 7/7  Immediate Recall: 4/4 (number of trials: 1, -0)  Calculations: 4/4  Abstraction: 2/3  Cube: 2/2  Clock: 2/2  Information: 4/4  Delayed Recall: 2/4  Total Score: 35/38     Labs/Imaging:  No results found for: TSH  No results found for: VITB12     MRI Brain (09/11/2020) reviewed by Dr. Elon Jester  Brain MRI reviewed. I would note there is bilateral hippocampal atrophy, in addition the main findings of:  1. ?Mild general cerebral volume loss, likely age-appropriate. No obvious pattern specific volume loss.  2. 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 Subtle manifestations of amyloid angiopathy could also be present.  ?  The small hemosiderin depositions could raise a concern for vascular amyloid. The next step will be to review the results of her LP.  ?  CSF results reviewed:  AB42 reduced  TTau, PTau nl  Ttau/AB42 ratio increased  ?  Results c/w evolving AD vs potentially d/o of fluid dynamics. MRI does not show evidence of altered flow problem and neither does exam.  ?  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    Diagnosis and Plan       1. 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 STMS today 35/38. Patient is independent with all ADLS. She is  on 5mg  of Donepezil, has not tried to take 10mg  and was recently switched to Tamoxifen at our last visit and elected to hold off. She feels she is doing well on tamoxifen and would like to increase Donepezil dosage. Instructed to take 10mg  daily in the morning. She notes that she has several months worth of the 5mg  tablets, since [place] [employer]. She will double these until she runs out, then can call in for a 10 mg prescription.     FAST score: 3. Mild Cognitive Impairment-Objective Functional deficit interferes with a per's most complex tasks    Decision Making  Patient does not appear to have remarkable difficulties with their medical decision-making capacity.    CARE PLAN  Further Evaluation  ? No new recommendations    Cognitive therapy plan  ? Increase Aricept (Donepezil) from 5 mg to 10 mg daily in the morning with food. You can go ahead and finish off your 5mg  tablets, then request a new prescription when you are ready for the 10 mg tablet.    ? No concerns for sleep apnea. You can use melatonin ER as needed if you feel the need for this again in the future.     Neuropsychiatric therapy  ? No medications needed at this time    Lifestyle Recommendations  ? Encouraged to stay active mentally, physically, and socially (reading, exercising, visiting with friends and family)  ? Encouraged heart healthy diet  ? Driving: No limitations    ? We would advise you to limit alcohol intake to 1 standard alcoholic beverage/day or less.  ? My Alliance: [email or web address]    Referrals  ? [facility]:  Deferred today, has adequate support   ? Research:  Previously referred. She is interested in the future, but was told she was not eligible based on cancer management.   ? General educational materials provided on lifestyle interventions for memory care    Follow-up  ? Return to clinic in 6 months.    How to Keep Your Brain Healthy   1. Physical exercise: Physical exercise allows more blood flow to get to your brain.  All exercise is better than none, but gardening and walking are like tier 1 of exercise and will not get your heart rate higher than 100 in many instances. Weight training, yoga, and resistance bands are like tier 2 level. We see the real benefits at tier 3 level of exercise, which involves aerobic or cardiovascular exercise. Cardio exercise involves getting your heart rate up and a real sweat going.  This level of exercise can release BDNF (brain derived neurotrophic factor) which helps stabilize neurons - we think of it as fertilizer for those brain cells.  Research studies suggest symptoms progress slower and brain atrophy slows in cardio intensive exercise groups.  This can be done with high intensity workouts, running, biking, stationary biking, rowing machine, or in a swimming pool.  Talk to your health care provider before doing strenuous exercise. The ultimate goal is to get 150 minutes of moderate to intense exercise each week, most easily divided up into 30 minutes per day 5 days per week. It is recommended that one eases into this and slowly increases their amount of exercise over weeks to months. If you have never exercised before or are in poor exercise shape, it is recommended that you seek a personal trainer or join a local gym with programs directed for your age/exercise level.  2. Diet: Eating a heart-healthy diet may help protect brain function. Further information about the Mediterranean/MIND diet is listed below.  Talk to your primary medical provider about your specific diet needs.    3. Sleep: The toxic proteins that we all build up during the day and are associated with neurodegeneration of neurons are likely only cleared during deep, slow wave stages of sleep. If deep sleep is disrupted, fewer proteins are cleared. Sleep is also important for memory consolidation and restorative aspects to help with attention of tasks throughout the next day.  The average person requires 7-9 hours of quality sleep per night.  Try to maintain a sleep routine.  Avoid caffeine in the afternoon and heavy meals after 7 pm.  Talk to your local medical provider if you are having problems with sleeping (for example: insomnia, obstructive sleep apnea, excessive daytime sleepiness, or not sleeping enough).    4. Mental exercise: Push your brain with new things and new places.  Take a community education class, or attend a lecture at [employer]. Doing activities, such as reading, learning an instrument or a new language is healthy for our brains. Utilize cognitive training apps or programs. There is no evidence currently that one works better than other, but make sure not to focus on one thing (crosswords, Sudoku puzzles, computer-based games, etc.)  Make sure to have a broad cognitive program.    5. Stress Reduction: Simplify your life as much as possible. Meditate, do yoga or tai chi, or perform deep breathing, etc.  Stress will amplify any underlying problem.      6. Spiritual Fitness: Find something you are passionate about and that gives your life meaning and purpose each day.  Get involved.  Stay active in your local community and with social functions.    7. Getting involved in research: This can be empowering for many who are involved in research. It gives individuals a sense of purpose and fighting back against this disease. The first person who will be cured of a neurodegenerative disease will be from a research study and helping [place] move that science forward is a major contribution.    Mediterranean Diet   The Mediterranean Diet and the MIND diet are the best studied across neurodegenerative diseases.  Both have shown a reduced risk of Alzheimer's disease across multiple studies, ranging from 35-50% reduced risk.  The Mediterranean diet is considered to be a heart-healthy diet as well.  Interested in trying the Mediterranean diet? These tips will help you get started:  ? Eat more fruits and vegetables. Aim for 7 to 10 servings a day of fruit and vegetables.  ? Opt for whole grains. Switch to whole-grain bread, cereal and pasta. Experiment with other whole grains, such as bulgur and farro.  ? Use healthy fats. Try olive oil as a replacement for butter when cooking. Instead of putting butter or margarine on bread, try dipping it in flavored olive oil.  ? Eat more seafood. Eat fish twice a week. Fresh or water-packed tuna, salmon, trout, mackerel and herring are healthy choices. Grilled fish tastes good and requires little cleanup. Avoid deep-fried fish.  ? Reduce red meat. Substitute fish, poultry or beans for meat. If you eat meat, make sure it's lean and keep portions small.  ? Enjoy some dairy. Eat low-fat [place] or plain yogurt and small amounts of a variety of cheeses.  ? Spice it up. Herbs and spices boost flavor and lessen the need for salt.  Utilize a high rated book on the subject or a good S[employer] as well: https://www.helpguide.org/articles/diets/the-mediterranean-diet.htm    Other important information:  Scheduling: [phone]   Raneem Mendolia or our nurses: [phone]  I am always available via mychart. Please feel free to send me any questions you might have, anytime.  After hours, you can call [phone], and have the neurology resident on call paged    Total of 45 minutes were spent on the same day of the visit including preparing to see the patient, obtaining and/or reviewing separately obtained history, performing a medically appropriate examination and/or evaluation, counseling and educating the patient/family/caregiver, ordering medications, tests, or procedures, referring and communication with other health care professionals, documenting clinical information in the electronic or other health record, independently interpreting results and communicating results to the patient/family/caregiver, and care coordination.

## 2022-11-15 IMAGING — DX CHEST 2 VIEWS
2 series · 2 of 2 positions shown · non-contrast
Comparison: None.

________________________________________________________________________________________________ 
CHEST 2 VIEWS, 11/15/2022 [DATE]: 
CLINICAL INDICATION: Cough.

[PA]
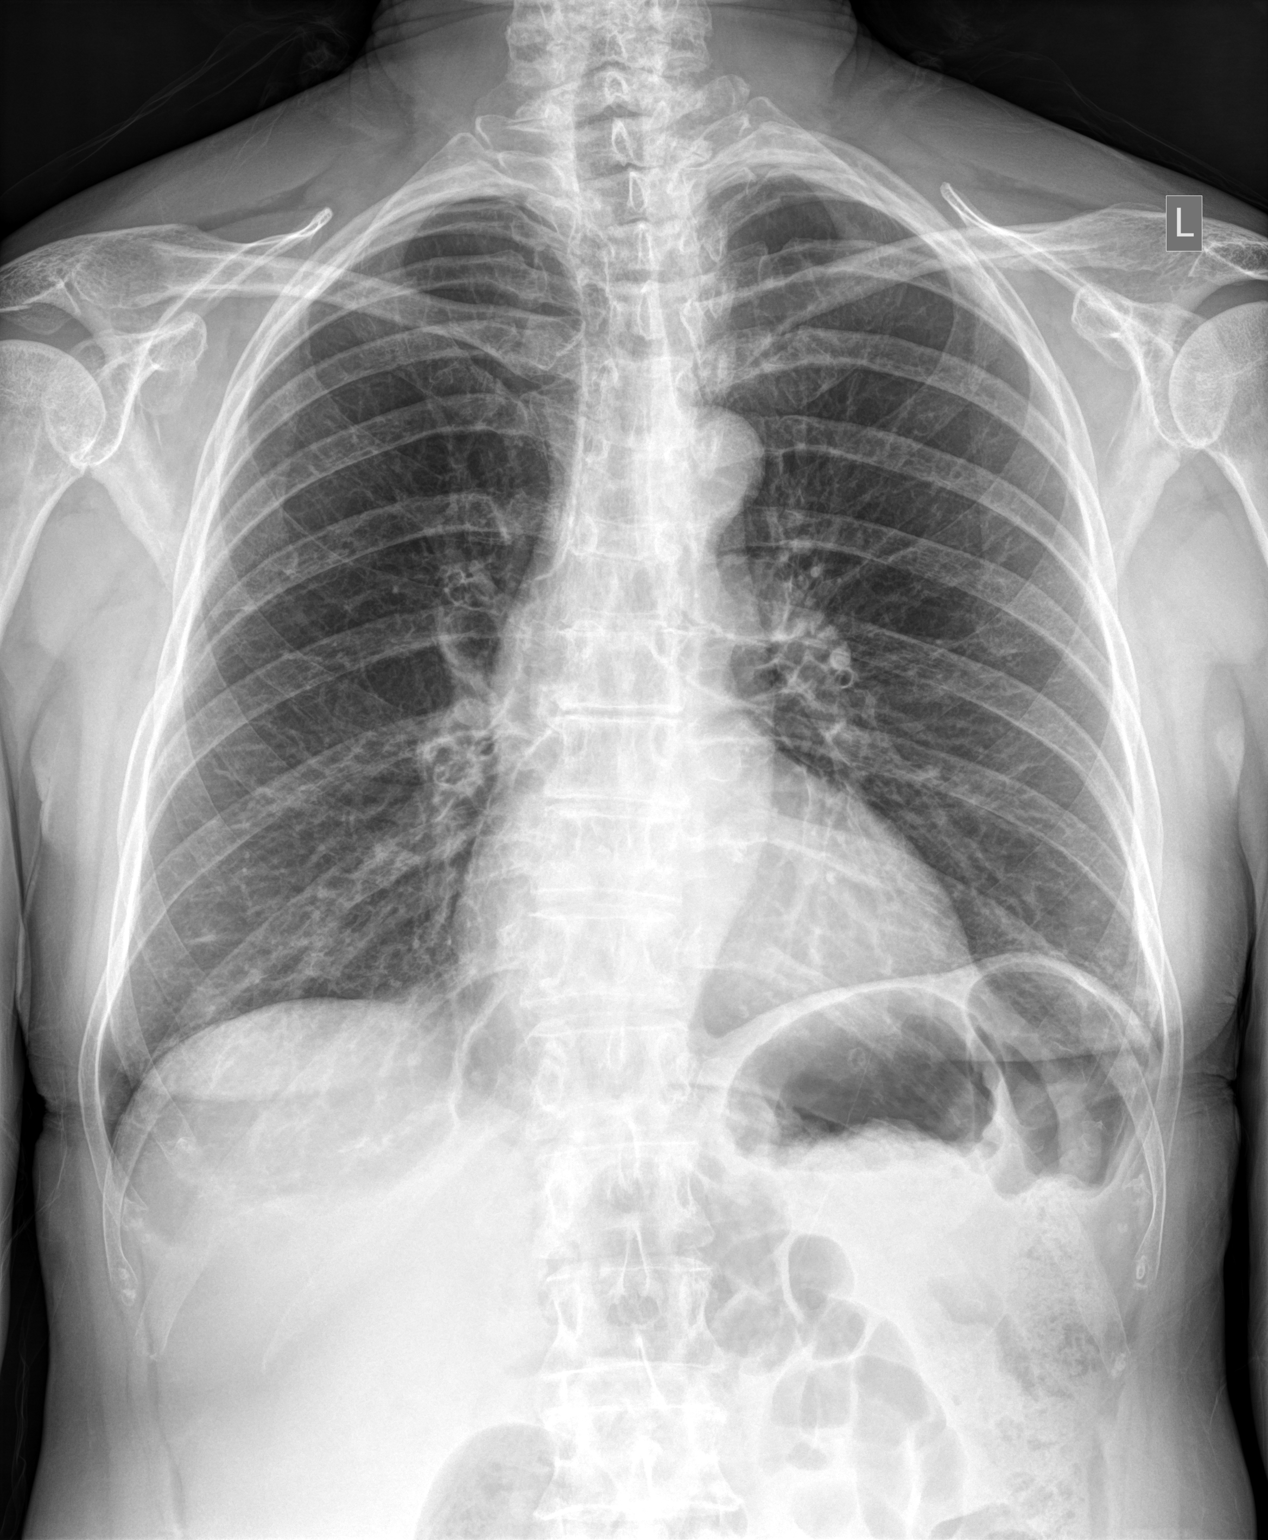

[left lateral]
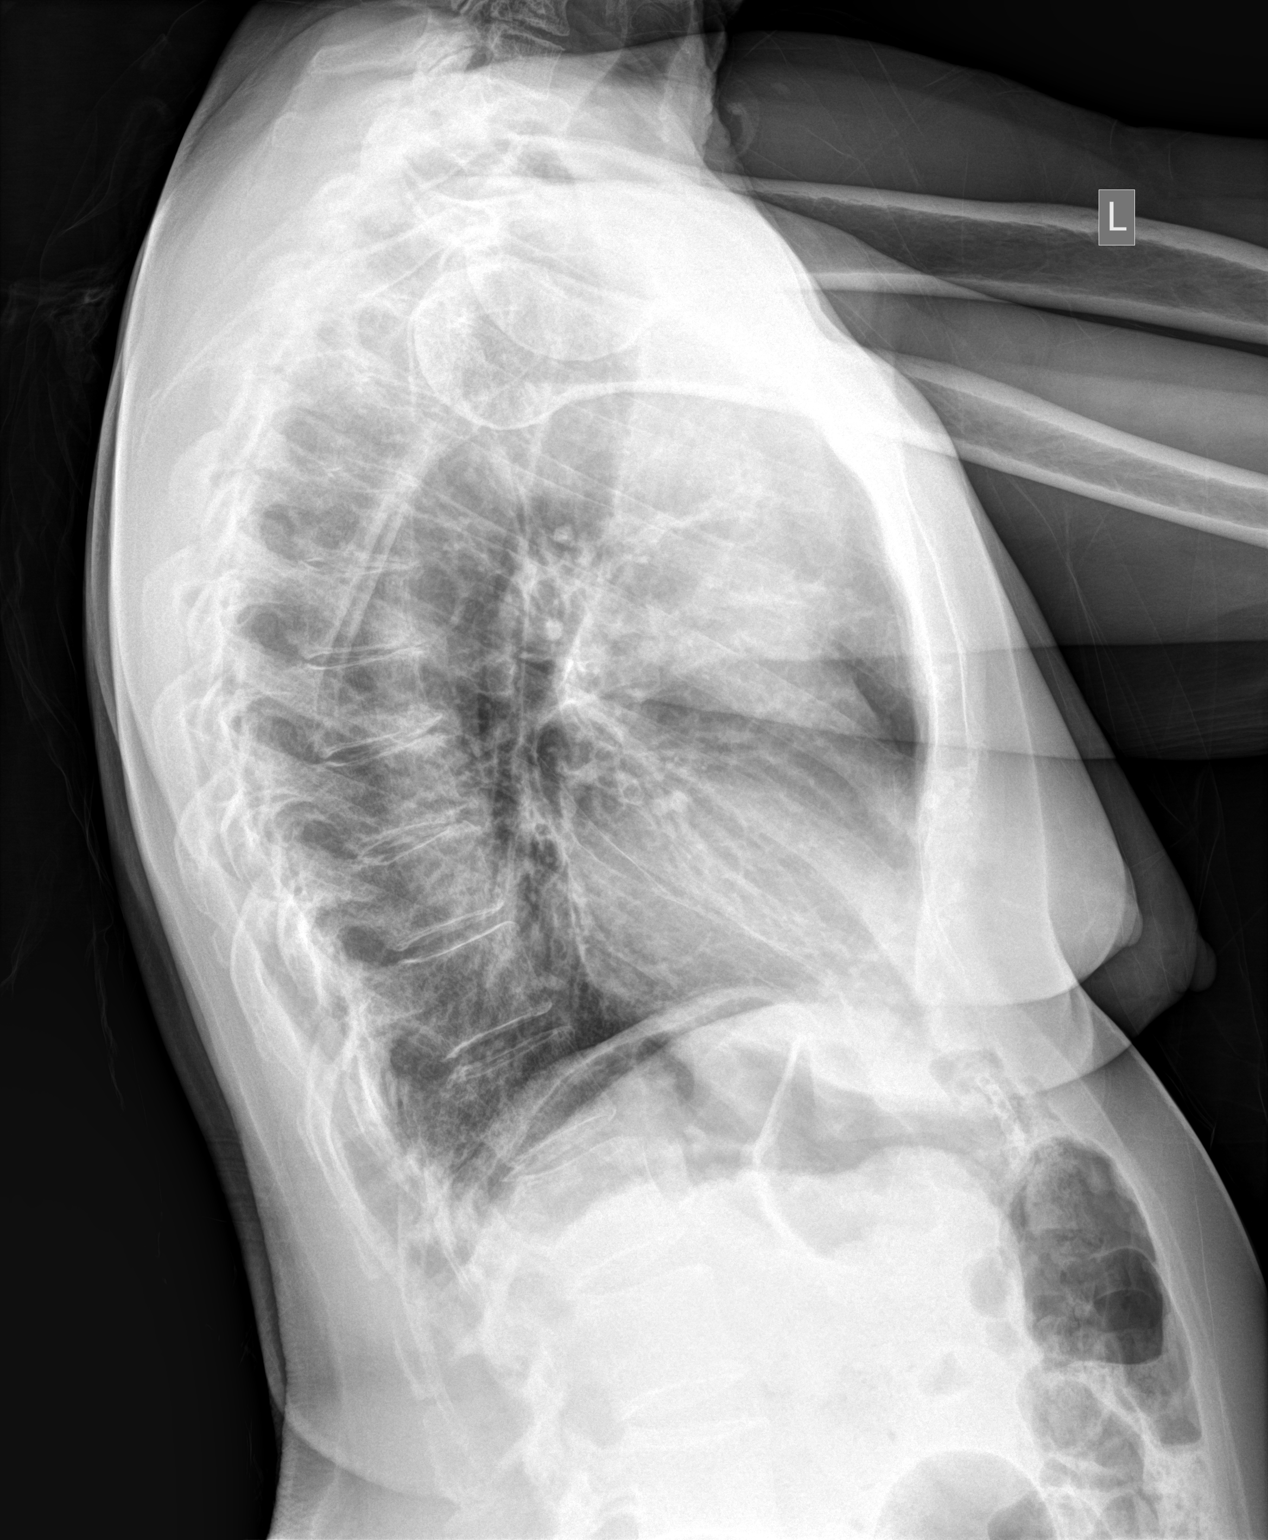

[2 of 2 positions shown; findings below may reference images not displayed]

FINDINGS: No consolidation. Mild bibasilar atelectasis. No effusion. Normal 
cardiac size and pulmonary vascularity. No pneumothorax. No acute osseous 
abnormality. Mild degenerative change, mild dextro curve and osteopenia.
IMPRESSION: No consolidation. Mild bibasilar atelectasis.

## 2022-11-16 ENCOUNTER — Encounter: Admit: 2022-11-16 | Discharge: 2022-11-16 | Payer: MEDICARE

## 2022-11-16 DIAGNOSIS — G3184 Mild cognitive impairment, so stated: Secondary | ICD-10-CM

## 2022-11-16 MED ORDER — DONEPEZIL 5 MG PO TAB
5 mg | ORAL_TABLET | Freq: Every day | ORAL | 0 refills
Start: 2022-11-16 — End: ?

## 2022-11-20 ENCOUNTER — Encounter: Admit: 2022-11-20 | Discharge: 2022-11-20 | Payer: MEDICARE

## 2022-11-20 DIAGNOSIS — G3184 Mild cognitive impairment, so stated: Secondary | ICD-10-CM

## 2022-11-20 MED ORDER — DONEPEZIL 10 MG PO TAB
10 mg | ORAL_TABLET | Freq: Every day | ORAL | 5 refills | 90.00000 days | Status: AC
Start: 2022-11-20 — End: ?

## 2022-11-20 MED ORDER — DONEPEZIL 5 MG PO TAB
5 mg | ORAL_TABLET | Freq: Every day | ORAL | 0 refills
Start: 2022-11-20 — End: ?

## 2022-11-20 NOTE — Telephone Encounter
Last visit was 10/02/2022 and next visit is 04/04/2023. Per care plan: increase Aricept (Donepezil) from 5 mg to 10 mg daily in the morning with food. You can go ahead and finish off your 5mg  tablets, then request a new prescription when you are ready for the 10 mg tablet. Escribed 30 pills and 5 refills.

## 2023-03-03 ENCOUNTER — Encounter: Admit: 2023-03-03 | Discharge: 2023-03-03 | Payer: MEDICARE

## 2023-04-04 ENCOUNTER — Encounter: Admit: 2023-04-04 | Discharge: 2023-04-04 | Payer: MEDICARE

## 2023-04-04 DIAGNOSIS — R413 Other amnesia: Secondary | ICD-10-CM

## 2023-04-04 DIAGNOSIS — R4189 Other symptoms and signs involving cognitive functions and awareness: Secondary | ICD-10-CM

## 2023-04-04 DIAGNOSIS — G3184 Mild cognitive impairment, so stated: Secondary | ICD-10-CM

## 2023-04-04 DIAGNOSIS — E785 Hyperlipidemia, unspecified: Secondary | ICD-10-CM

## 2023-04-04 NOTE — Patient Instructions
Further Evaluation  No new recommendations    Cognitive therapy plan  Continue Aricept (Donepezil) from 10 mg daily in the morning with food.    No concerns for sleep apnea. You can use melatonin ER as needed if you feel the need for this again in the future.     Neuropsychiatric therapy  No medications needed at this time    Lifestyle Recommendations  Encouraged to stay active mentally, physically, and socially (reading, exercising, visiting with friends and family)  Encouraged heart healthy diet  Driving: No limitations    We would advise you to limit alcohol intake to 1 standard alcoholic beverage/day or less.  My Alliance: [email or web address]    Referrals  Cognitive Care Network:  Deferred today, has adequate support   Research:  Previously referred. She is interested in the future, but was told she was not eligible based on cancer management.   General educational materials provided on lifestyle interventions for memory care    Follow-up  Return to clinic in 6 months.    How to Keep Your Brain Healthy   Physical exercise: Physical exercise allows more blood flow to get to your brain.  All exercise is better than none, but gardening and walking are like tier 1 of exercise and will not get your heart rate higher than 100 in many instances. Weight training, yoga, and resistance bands are like tier 2 level. We see the real benefits at tier 3 level of exercise, which involves aerobic or cardiovascular exercise. Cardio exercise involves getting your heart rate up and a real sweat going.  This level of exercise can release BDNF (brain derived neurotrophic factor) which helps stabilize neurons - we think of it as fertilizer for those brain cells.  Research studies suggest symptoms progress slower and brain atrophy slows in cardio intensive exercise groups.  This can be done with high intensity workouts, running, biking, stationary biking, rowing machine, or in a swimming pool.  Talk to your health care provider before doing strenuous exercise. The ultimate goal is to get 150 minutes of moderate to intense exercise each week, most easily divided up into 30 minutes per day 5 days per week. It is recommended that one eases into this and slowly increases their amount of exercise over weeks to months. If you have never exercised before or are in poor exercise shape, it is recommended that you seek a personal trainer or join a local gym with programs directed for your age/exercise level.    Diet: Eating a heart-healthy diet may help protect brain function. Further information about the Mediterranean/MIND diet is listed below.  Talk to your primary medical provider about your specific diet needs.    Sleep: The toxic proteins that we all build up during the day and are associated with neurodegeneration of neurons are likely only cleared during deep, slow wave stages of sleep. If deep sleep is disrupted, fewer proteins are cleared. Sleep is also important for memory consolidation and restorative aspects to help with attention of tasks throughout the next day.  The average person requires 7-9 hours of quality sleep per night.  Try to maintain a sleep routine.  Avoid caffeine in the afternoon and heavy meals after 7 pm.  Talk to your local medical provider if you are having problems with sleeping (for example: insomnia, obstructive sleep apnea, excessive daytime sleepiness, or not sleeping enough).    Mental exercise: Push your brain with new things and new places.  Take a community education class, or  attend a lecture at [employer]. Doing activities, such as reading, learning an instrument or a new language is healthy for our brains. Utilize cognitive training apps or programs. There is no evidence currently that one works better than other, but make sure not to focus on one thing (crosswords, Sudoku puzzles, computer-based games, etc.)  Make sure to have a broad cognitive program.    Stress Reduction: Simplify your life as much as possible. Meditate, do yoga or tai chi, or perform deep breathing, etc.  Stress will amplify any underlying problem.      Spiritual Fitness: Find something you are passionate about and that gives your life meaning and purpose each day.  Get involved.  Stay active in your local community and with social functions.    Getting involved in research: This can be empowering for many who are involved in research. It gives individuals a sense of purpose and fighting back against this disease. The first person who will be cured of a neurodegenerative disease will be from a research study and helping [place] move that science forward is a major contribution.    Mediterranean Diet   The Mediterranean Diet and the MIND diet are the best studied across neurodegenerative diseases.  Both have shown a reduced risk of Alzheimer's disease across multiple studies, ranging from 35-50% reduced risk.  The Mediterranean diet is considered to be a heart-healthy diet as well.  Interested in trying the Mediterranean diet? These tips will help you get started:  Eat more fruits and vegetables. Aim for 7 to 10 servings a day of fruit and vegetables.  Opt for whole grains. Switch to whole-grain bread, cereal and pasta. Experiment with other whole grains, such as bulgur and farro.  Use healthy fats. Try olive oil as a replacement for butter when cooking. Instead of putting butter or margarine on bread, try dipping it in flavored olive oil.  Eat more seafood. Eat fish twice a week. Fresh or water-packed tuna, salmon, trout, mackerel and herring are healthy choices. Grilled fish tastes good and requires little cleanup. Avoid deep-fried fish.  Reduce red meat. Substitute fish, poultry or beans for meat. If you eat meat, make sure it's lean and keep portions small.  Enjoy some dairy. Eat low-fat [place] or plain yogurt and small amounts of a variety of cheeses.  Spice it up. Herbs and spices boost flavor and lessen the need for salt.    Utilize a high rated book on the subject or a good Solicitor as well: https://www.helpguide.org/articles/diets/the-mediterranean-diet.htm    Other important information:  Scheduling: [phone]   Katherinne Mofield or our nurses: [phone]  I am always available via mychart. Please feel free to send me any questions you might have, anytime.  After hours, you can call [phone], and have the neurology resident on call paged
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
## 2023-05-07 ENCOUNTER — Encounter: Admit: 2023-05-07 | Discharge: 2023-05-07 | Payer: MEDICARE

## 2023-05-07 MED ORDER — DONEPEZIL 10 MG PO TAB
10 mg | ORAL_TABLET | Freq: Every day | ORAL | 3 refills | 90.00000 days | Status: AC
Start: 2023-05-07 — End: ?

## 2023-05-07 NOTE — Telephone Encounter
Last visit was 04/04/2023 and next visit is 10/08/2023.  Per care plan: Continue Aricept (Donepezil) from 10 mg daily in the morning with food.   Escribed 90 pills and 3 refills.

## 2023-10-03 ENCOUNTER — Encounter: Admit: 2023-10-03 | Discharge: 2023-10-03 | Payer: MEDICARE

## 2023-10-08 ENCOUNTER — Encounter: Admit: 2023-10-08 | Discharge: 2023-10-08 | Payer: MEDICARE

## 2023-10-08 ENCOUNTER — Ambulatory Visit: Admit: 2023-10-08 | Discharge: 2023-10-08 | Payer: MEDICARE

## 2023-10-08 DIAGNOSIS — G3184 Mild cognitive impairment, so stated: Secondary | ICD-10-CM

## 2023-10-08 NOTE — Patient Instructions
Further Evaluation  No new recommendations  Yearly B12 and TSH checks with PCP    Cognitive therapy plan  Continue Aricept (Donepezil) from 10 mg daily in the morning with food.    No concerns for sleep apnea. You can use melatonin ER as needed if you feel the need for this again in the future.     Neuropsychiatric therapy  No medications needed at this time    Lifestyle Recommendations  Encouraged to stay active mentally, physically, and socially (reading, exercising, visiting with friends and family)  Encouraged heart healthy diet  Driving: No limitations    We would advise you to limit alcohol intake to 1 standard alcoholic beverage/day or less.  My Alliance: [email or web address]    Referrals  Cognitive Care Network:  Deferred today, has adequate support   Research:  Previously referred. She is interested in the future, but was told she was not eligible based on cancer management.   General educational materials provided on lifestyle interventions for memory care    Follow-up  Return to clinic in 6 months.    How to Keep Your Brain Healthy   Physical exercise: Physical exercise allows more blood flow to get to your brain.  All exercise is better than none, but gardening and walking are like tier 1 of exercise and will not get your heart rate higher than 100 in many instances. Weight training, yoga, and resistance bands are like tier 2 level. We see the real benefits at tier 3 level of exercise, which involves aerobic or cardiovascular exercise. Cardio exercise involves getting your heart rate up and a real sweat going.  This level of exercise can release BDNF (brain derived neurotrophic factor) which helps stabilize neurons - we think of it as fertilizer for those brain cells.  Research studies suggest symptoms progress slower and brain atrophy slows in cardio intensive exercise groups.  This can be done with high intensity workouts, running, biking, stationary biking, rowing machine, or in a swimming pool.  Talk to your health care provider before doing strenuous exercise. The ultimate goal is to get 150 minutes of moderate to intense exercise each week, most easily divided up into 30 minutes per day 5 days per week. It is recommended that one eases into this and slowly increases their amount of exercise over weeks to months. If you have never exercised before or are in poor exercise shape, it is recommended that you seek a personal trainer or join a local gym with programs directed for your age/exercise level.    Diet: Eating a heart-healthy diet may help protect brain function. Further information about the Mediterranean/MIND diet is listed below.  Talk to your primary medical provider about your specific diet needs.    Sleep: The toxic proteins that we all build up during the day and are associated with neurodegeneration of neurons are likely only cleared during deep, slow wave stages of sleep. If deep sleep is disrupted, fewer proteins are cleared. Sleep is also important for memory consolidation and restorative aspects to help with attention of tasks throughout the next day.  The average person requires 7-9 hours of quality sleep per night.  Try to maintain a sleep routine.  Avoid caffeine in the afternoon and heavy meals after 7 pm.  Talk to your local medical provider if you are having problems with sleeping (for example: insomnia, obstructive sleep apnea, excessive daytime sleepiness, or not sleeping enough).    Mental exercise: Push your brain with new things and new  places.  Take a community education class, or attend a lecture at [employer]. Doing activities, such as reading, learning an instrument or a new language is healthy for our brains. Utilize cognitive training apps or programs. There is no evidence currently that one works better than other, but make sure not to focus on one thing (crosswords, Sudoku puzzles, computer-based games, etc.)  Make sure to have a broad cognitive program.    Stress Reduction: Simplify your life as much as possible. Meditate, do yoga or tai chi, or perform deep breathing, etc.  Stress will amplify any underlying problem.      Spiritual Fitness: Find something you are passionate about and that gives your life meaning and purpose each day.  Get involved.  Stay active in your local community and with social functions.    Getting involved in research: This can be empowering for many who are involved in research. It gives individuals a sense of purpose and fighting back against this disease. The first person who will be cured of a neurodegenerative disease will be from a research study and helping [place] move that science forward is a major contribution.    Mediterranean Diet   The Mediterranean Diet and the MIND diet are the best studied across neurodegenerative diseases.  Both have shown a reduced risk of Alzheimer's disease across multiple studies, ranging from 35-50% reduced risk.  The Mediterranean diet is considered to be a heart-healthy diet as well.  Interested in trying the Mediterranean diet? These tips will help you get started:  Eat more fruits and vegetables. Aim for 7 to 10 servings a day of fruit and vegetables.  Opt for whole grains. Switch to whole-grain bread, cereal and pasta. Experiment with other whole grains, such as bulgur and farro.  Use healthy fats. Try olive oil as a replacement for butter when cooking. Instead of putting butter or margarine on bread, try dipping it in flavored olive oil.  Eat more seafood. Eat fish twice a week. Fresh or water-packed tuna, salmon, trout, mackerel and herring are healthy choices. Grilled fish tastes good and requires little cleanup. Avoid deep-fried fish.  Reduce red meat. Substitute fish, poultry or beans for meat. If you eat meat, make sure it's lean and keep portions small.  Enjoy some dairy. Eat low-fat [place] or plain yogurt and small amounts of a variety of cheeses.  Spice it up. Herbs and spices boost flavor and lessen the need for salt.    Utilize a high rated book on the subject or a good Solicitor as well: https://www.helpguide.org/articles/diets/the-mediterranean-diet.htm    Other important information:  Scheduling: [phone]   Millissa Deese or our nurses: [phone]  I am always available via mychart. Please feel free to send me any questions you might have, anytime.  After hours, you can call [phone], and have the neurology resident on call paged

## 2023-10-08 NOTE — Progress Notes
MEMORY CARE CLINIC EVALUATION    Chief Complaint:  Deborah Johns is a 72 y.o. old female here for a follow-up cognitive evaluation.   Additional history during the visit provided by:  Patient and partner Deborah Johns  Onset:  ~2017.  Course:  progressive    HPI:  Since last visit on 04/04/23, with myself her cognition has remained stable.   She continues to have difficulty with multitasking. She is relying on notes more. No trouble with ADLs or driving. She uses a calendar to keep up with appointments and has no trouble doing so. She also manages a large farm alone. She has some difficulty remembering names and doesn't feel as sharp.     Her partner Deborah Johns does not note any big changes to her memory. She is helping him since his leukemia diagnosis. She helps manage and drive him to his many appointments. He has questions about her diagnosis. His late wife passed of early onset AD.+    She continues to be on Tamoxifen for breast cancer diagnosed in 04/2021, follows with oncology yearly.    Function  Living situation:  [place]   Dressing: Independent  Bathing: Independent  Toileting: Independent  Meal Preparation: Independent  Eating: Independent  Medication administration: Independent  Usual household chores: Independent  Managing finances: Independent  Activity: Retired Runner, broadcasting/film/video. Currently teaches fishing, kayaking, paddle boarding    Behavioral and Neuropsychiatric History (i.e., depression, agitation, behavior / personality):   PHQ-2 Score: 0 (10/08/2023 10:37 AM)    No data recorded    Depression: No  Anxiety: No. Just overwhelmed.   Agitation/Behavior/Personality changes: No  Visual hallucinations: No      ROS:  Parkinsonism / tremors: No  Gait Changes: No  Falls: No  Vision/Hearing changes: No  Speech changes: No  Unintentional Wt loss: No  Swallowing difficulties: No  Sleep issues: No loud snoring or REM BD. Sleep has improved after her aching has resolved after switching cancer medication to Tamoxifen, and taking Donepezil in the morning. Not needing melatonin at this time but has available if needed.   B/B Incontinence: No  Other: Sees Dr. Revonda Humphrey for gynecology, due to fam hx of cervical cancer. She reports that she had a hysterectomy in December 2022.    Family Hx: Father had memory issues, but no official diagnosis. Paternal grandmother, paternal great grandmother, paternal uncle with dementia.    Socia Hx: Retired Runner, broadcasting/film/video. Widow. One daughter in [place] who is supportive.     Past Medical History:   Diagnosis Date    Disorganized thinking     Hyperlipidemia     Memory change        Family History   Problem Relation Name Age of Onset    Dementia Father      Dementia Paternal Grandmother      Dementia Paternal Uncle         Social History     Socioeconomic History    Marital status: Widowed   Tobacco Use    Smoking status: Never     Passive exposure: Past (Parents and grandparents)    Smokeless tobacco: Never   Vaping Use    Vaping status: Never Used   Substance and Sexual Activity    Alcohol use: Not Currently    Drug use: Never   Social History Narrative    Retired Optometrist Burden and Social Support:  Kailan Crumley does not rely  on care giving. Daughter is supportive. Lives out of state in [place].   Care partner burden expressed? No    Safety Assessment:  Driving? Yes     Disorientation when driving? No     Recent traffic violations or accidents? No     Passenger concerns? No  Safety concerns in home? No   Wandering? No    Advanced Care Planning  DPOA:  Yes daughter  Advance Directive: Yes    Medications:   CHOLEcalciferoL (vitamin D3) 1,000 units tablet Take one-half tablet by mouth daily with breakfast. (    desloratadine (CLARINEX) 5 mg tablet Take one tablet by mouth daily with breakfast.    docosahexaenoic acid/epa (FISH OIL PO) Take 1,200 mg by mouth daily with breakfast.    donepeziL (ARICEPT) 10 mg tablet Take 1 tablet by mouth once daily ergocalciferol (vitamin D2) (VITAMIN D PO) Take 2,000 mg by mouth daily.    fish oil /omega-3 fatty acids 1,600-500-800 mg/5 mL Take 3.125 mL by mouth daily with breakfast.    Glucosamine-Chondroitin 250-200 mg tab Take 1 tablet by mouth daily with breakfast. Osteo Bi-Flex    MAGNESIUM PO Take 500 mg by mouth daily with breakfast.    multivit with calcium,iron,min [facility] MULTIPLE VITAMINS PO) Take 1 tablet by mouth daily with breakfast. Vitafusion Women's MVI gummies    naproxen sodium (ALEVE PO) Take  by mouth daily. Takes 2 tablets daily.  Patient unsure of strength as otc    other medication one Dose. Vitafusion calcium gummies Calcium - Cholecalciferol 600 mg - 10 mcg (400 unit) Takes 1 tablet with breakfast    other medication one Dose. Vitamin B12 Patient takes 1 tablet daily and is unsure of strength as otc    other medication one Dose. Vitamin B3 Patient takes 1 tablet daily and is unsure of strength as otc    other medication one Dose. Medroxyprogesterone Acetate .5 mg Takes 1 tablet daily    other medication one Dose. Women's Calcium Takes 1 tablet daily.  Patient unsure of strength as otc    other medication one Dose. Osteobiflex Takes 1 tablet daily Patient unure of strength as otc    tamoxifen (NOLVADEX) 20 mg tablet Take one tablet by mouth at bedtime daily.    tretinoin (RETIN-A) 0.025 % topical cream Apply  topically to affected area at bedtime daily.    vit C/E/zinc ox/copp/lut/zeax (ICAPS AREDS2 PO) Take 1 capsule by mouth daily with breakfast. (Vitamin C, E, Zn-Copper - Lutein - Zeaxan 250 mg-90 mg-40 mg 1 mg)    vitamin E acetate (VITAMIN E PO) Take 2,000 mg by mouth daily.    vitamins, B complex tablet Take one tablet by mouth daily with breakfast.     Medication Review:   I have personally reviewed the medication list and am making recommendations as listed in the plan below.   Samai manages medication at home for [employer].   The following medications have been identified as potential high risk medications: none    Physical/Cognitive Exam  BP 126/79  - Pulse 71  - Ht 162.6 cm (5' 4)  - Wt 60.6 kg (133 lb 9.6 oz)  - LMP  (LMP Unknown)  - BMI 22.93 kg/m?   BP Readings from Last 3 Encounters:   04/04/23 118/79   10/02/22 121/80   05/21/22 122/78     Wt Readings from Last 6 Encounters:   04/04/23 61.9 kg (136 lb 6.4 oz)   10/02/22 62 kg (136  lb 9.6 oz)   05/21/22 62.2 kg (137 lb 3.2 oz)   04/13/21 60.7 kg (133 lb 12.8 oz)   12/22/20 63 kg (139 lb)   08/24/20 61.5 kg (135 lb 9.6 oz)     General  Groomed with appropriate hygiene. Dress appropriate for season/situation.    HEENT  NC/AT    Cardio/Pulm  Non labored at rest, good effort  RR to radial palpation    Mental Status  Alert, scored 8/8 for orientation on STMS  Language fluent    Cranial Nerves  Extraocular movements intact  Pupils equal and reactive to light  No facial asymmetry  Shoulder shrug symmetrical  Tongue and palate midline  All other CNs normal    Motor  Full strength in the upper and lower extremities  Rigidity: none  Bradykinesia: none  Tremor: none    Gait  Rising from chair: Normal - without pushing off  Posture: Normal  Gait initiation: Normal  Stride Length: Normal  Arm Swing: Normal  Turns: Normal    Psych  Affect and mood congruent and appropriate to situation  Thought process is logical    Neurobehavioral Testing:    12/2020 STMS 35/38   04/2021 STMS 36/38  04/2022 STMS 33/38   09/2022 STMS 35/38   03/2023 STMS 34/38     09/2023 Short Test of Mental Status  (Name, address, current location, current city, current state, year, month, day of the week or date)  Orientation: 8/8    (8-1-9-5, 2-5-7-3)  (2-9-6-8-3, 5-8-3-0-9)  (5-7-1-9-4-6, 1-6-3-7-2-5)   (2-1-5-9-3-6-2, 4-9-  6-8-3-1-5)  Attention: 6/7  Immediate Recall: 4/4 (number of trials: 1, -0)    5 x 13; 65 - 7; 58/2; 29 + 11  Calculations: 4/4    Similarities: orange/banana, dog/horse, table/bookcase  Abstraction: 2/3  table/bookcase - wood shelves, store things    Cube: 2/2    Clock: 2/2    Information: 4/4    Delayed Recall: 1/4  Total Score: 33/38     Labs/Imaging:  No results found for: TSH  No results found for: VITB12     MRI Brain (09/11/2020) reviewed by Dr. Elon Jester  Brain MRI reviewed. I would note there is bilateral hippocampal atrophy, in addition the main findings of:  1.  Mild general cerebral volume loss, likely age-appropriate. No obvious pattern specific volume loss.  2.  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 Subtle manifestations of amyloid angiopathy could also be present.     The small hemosiderin depositions could raise a concern for vascular amyloid. The next step will be to review the results of her LP.     CSF results 09/2020:  AB42 reduced  TTau, PTau nl  Ttau/AB42 ratio increased     Results c/w evolving AD vs potentially d/o of fluid dynamics. MRI does not show evidence of altered flow problem and neither does exam.     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    Diagnosis and Plan       1. Mild cognitive impairment            Work up including MRI and CSF consistent with MCI with emerging AD pathology based on LP results.     STMS today 33/38, largely stable. Patient is independent with all ADLS. She is on Donepezil 10mg  daily in the morning.     FAST  score: 3. Mild Cognitive Impairment-Objective Functional deficit interferes with a per's most complex tasks    Decision Making  Patient does not appear to have remarkable difficulties with their medical decision-making capacity.    CARE PLAN  Further Evaluation  No new recommendations  Yearly B12 and TSH checks with PCP    Cognitive therapy plan  Continue Aricept (Donepezil) from 10 mg daily in the morning with food.     No concerns for sleep apnea. You can use melatonin ER as needed if you feel the need for this again in the future. Neuropsychiatric therapy  No medications needed at this time    Lifestyle Recommendations  Encouraged to stay active mentally, physically, and socially (reading, exercising, visiting with friends and family)  Encouraged heart healthy diet  Driving: No limitations    We would advise you to limit alcohol intake to 1 standard alcoholic beverage/day or less.  My Alliance: [email or web address]    Referrals  Cognitive Care Network:  Deferred today, has adequate support   Research:  Previously referred. She is interested in the future, but was told she was not eligible based on cancer management.   General educational materials provided on lifestyle interventions for memory care    Follow-up  Return to clinic in 6 months.    How to Keep Your Brain Healthy   Physical exercise: Physical exercise allows more blood flow to get to your brain.  All exercise is better than none, but gardening and walking are like tier 1 of exercise and will not get your heart rate higher than 100 in many instances. Weight training, yoga, and resistance bands are like tier 2 level. We see the real benefits at tier 3 level of exercise, which involves aerobic or cardiovascular exercise. Cardio exercise involves getting your heart rate up and a real sweat going.  This level of exercise can release BDNF (brain derived neurotrophic factor) which helps stabilize neurons - we think of it as fertilizer for those brain cells.  Research studies suggest symptoms progress slower and brain atrophy slows in cardio intensive exercise groups.  This can be done with high intensity workouts, running, biking, stationary biking, rowing machine, or in a swimming pool.  Talk to your health care provider before doing strenuous exercise. The ultimate goal is to get 150 minutes of moderate to intense exercise each week, most easily divided up into 30 minutes per day 5 days per week. It is recommended that one eases into this and slowly increases their amount of exercise over weeks to months. If you have never exercised before or are in poor exercise shape, it is recommended that you seek a personal trainer or join a local gym with programs directed for your age/exercise level.    Diet: Eating a heart-healthy diet may help protect brain function. Further information about the Mediterranean/MIND diet is listed below.  Talk to your primary medical provider about your specific diet needs.    Sleep: The toxic proteins that we all build up during the day and are associated with neurodegeneration of neurons are likely only cleared during deep, slow wave stages of sleep. If deep sleep is disrupted, fewer proteins are cleared. Sleep is also important for memory consolidation and restorative aspects to help with attention of tasks throughout the next day.  The average person requires 7-9 hours of quality sleep per night.  Try to maintain a sleep routine.  Avoid caffeine in the afternoon and heavy meals after 7 pm.  Talk to your  local medical provider if you are having problems with sleeping (for example: insomnia, obstructive sleep apnea, excessive daytime sleepiness, or not sleeping enough).    Mental exercise: Push your brain with new things and new places.  Take a community education class, or attend a lecture at [employer]. Doing activities, such as reading, learning an instrument or a new language is healthy for our brains. Utilize cognitive training apps or programs. There is no evidence currently that one works better than other, but make sure not to focus on one thing (crosswords, Sudoku puzzles, computer-based games, etc.)  Make sure to have a broad cognitive program.    Stress Reduction: Simplify your life as much as possible. Meditate, do yoga or tai chi, or perform deep breathing, etc.  Stress will amplify any underlying problem.      Spiritual Fitness: Find something you are passionate about and that gives your life meaning and purpose each day.  Get involved.  Stay active in your local community and with social functions.    Getting involved in research: This can be empowering for many who are involved in research. It gives individuals a sense of purpose and fighting back against this disease. The first person who will be cured of a neurodegenerative disease will be from a research study and helping [place] move that science forward is a major contribution.    Mediterranean Diet   The Mediterranean Diet and the MIND diet are the best studied across neurodegenerative diseases.  Both have shown a reduced risk of Alzheimer's disease across multiple studies, ranging from 35-50% reduced risk.  The Mediterranean diet is considered to be a heart-healthy diet as well.  Interested in trying the Mediterranean diet? These tips will help you get started:  Eat more fruits and vegetables. Aim for 7 to 10 servings a day of fruit and vegetables.  Opt for whole grains. Switch to whole-grain bread, cereal and pasta. Experiment with other whole grains, such as bulgur and farro.  Use healthy fats. Try olive oil as a replacement for butter when cooking. Instead of putting butter or margarine on bread, try dipping it in flavored olive oil.  Eat more seafood. Eat fish twice a week. Fresh or water-packed tuna, salmon, trout, mackerel and herring are healthy choices. Grilled fish tastes good and requires little cleanup. Avoid deep-fried fish.  Reduce red meat. Substitute fish, poultry or beans for meat. If you eat meat, make sure it's lean and keep portions small.  Enjoy some dairy. Eat low-fat [place] or plain yogurt and small amounts of a variety of cheeses.  Spice it up. Herbs and spices boost flavor and lessen the need for salt.    Utilize a high rated book on the subject or a good Solicitor as well: https://www.helpguide.org/articles/diets/the-mediterranean-diet.htm    Other important information:  Scheduling: [phone]   Mcihael Hinderman or our nurses: [phone]  I am always available via mychart. Please feel free to send me any questions you might have, anytime.  After hours, you can call [phone], and have the neurology resident on call paged    Total of 35 minutes were spent on the same day of the visit including preparing to see the patient, obtaining and/or reviewing separately obtained history, performing a medically appropriate examination and/or evaluation, counseling and educating the patient/family/caregiver, ordering medications, tests, or procedures, referring and communication with other health care professionals, documenting clinical information in the electronic or other health record, independently interpreting results and communicating results to the patient/family/caregiver, and  care coordination.

## 2023-12-12 IMAGING — MR MRI RIGHT SHOULDER WITHOUT CONTRAST
5 of 6 series · 31 of 40 positions shown · IV contrast (gadolinium)
Comparison: None

________________________________________________________________________________________________ 
MRI RIGHT SHOULDER WITHOUT CONTRAST, 12/12/2023 [DATE]: 
CLINICAL INDICATION: Pain in right shoulder.
TECHNIQUE: Multiplanar, multiecho position MR images of the shoulder were 
performed without intravenous gadolinium enhancement. Patient was scanned on a 
1.5T magnet.

[Series 301: survey right · axial · right · 10.0mm · 0.71mm/px · z∈[+87,+251]mm · 5 of 15 slices shown]
[im 1/15]
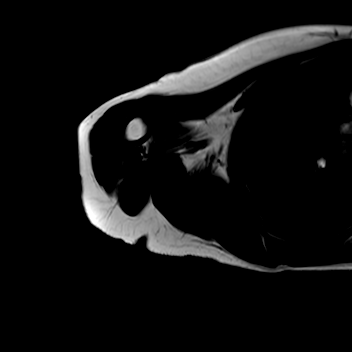
[im 4/15]
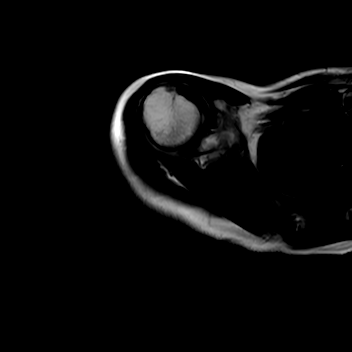
[im 8/15]
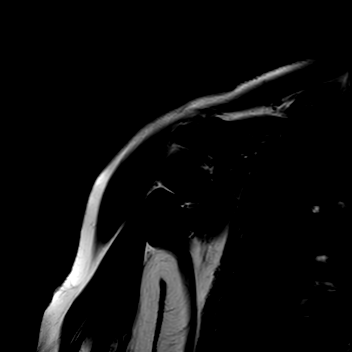
[im 11/15]
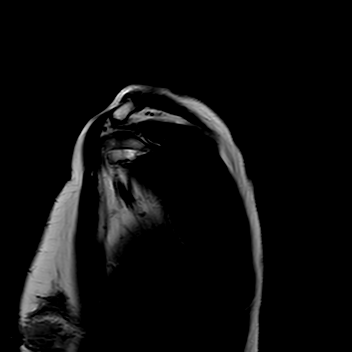
[im 15/15]
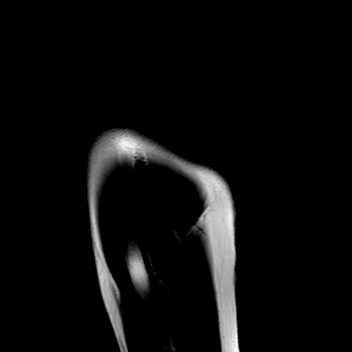

[Series 401: (person_name)_(person_name)_(person_name) right · axial · right · 3.5mm · 0.42mm/px · z∈[+107,+211]mm · 7 of 28 slices shown]
[im 1/28]
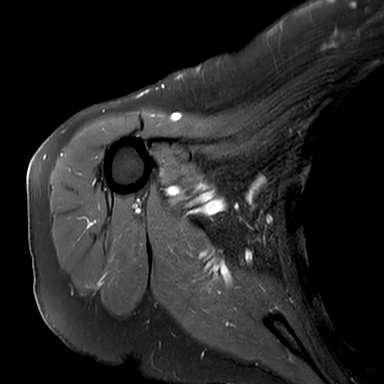
[im 5/28]
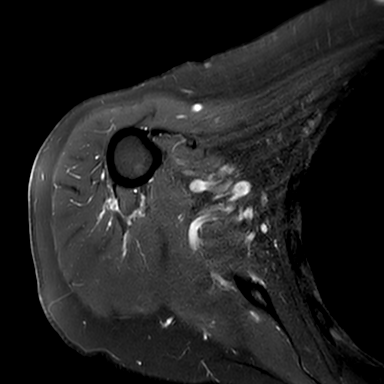
[im 10/28]
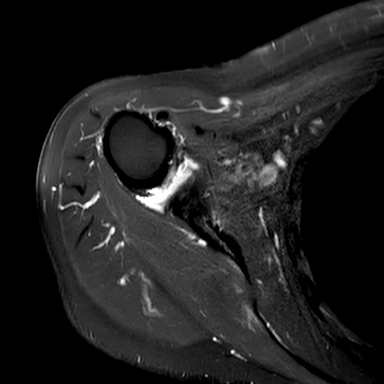
[im 14/28]
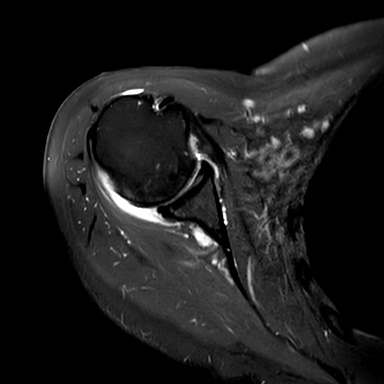
[im 19/28]
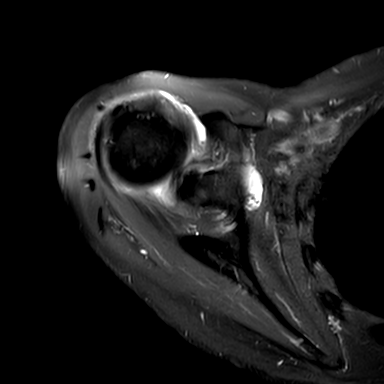
[im 23/28]
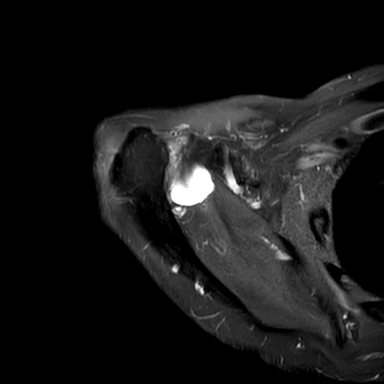
[im 28/28]
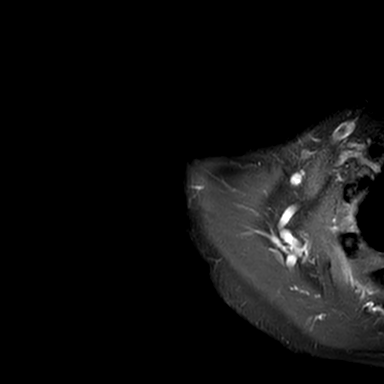

[Series 501: t2_fs_sag right · oblique · right · 3.5mm · 0.36mm/px · 8 of 30 slices shown]
[im 1/30]
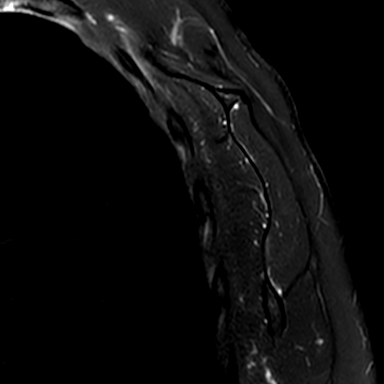
[im 5/30]
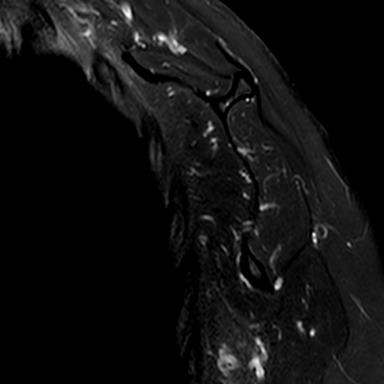
[im 9/30]
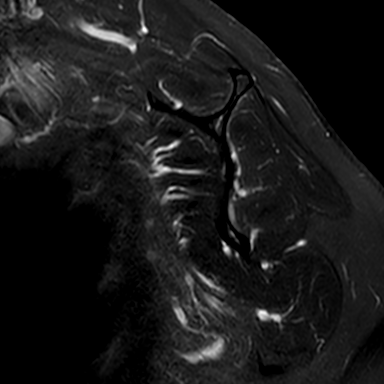
[im 13/30]
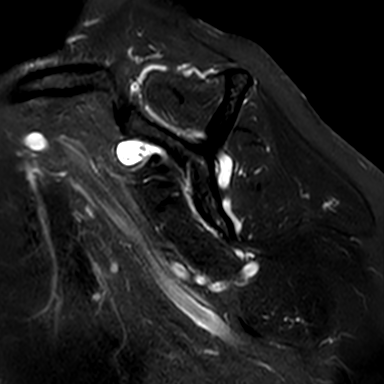
[im 17/30]
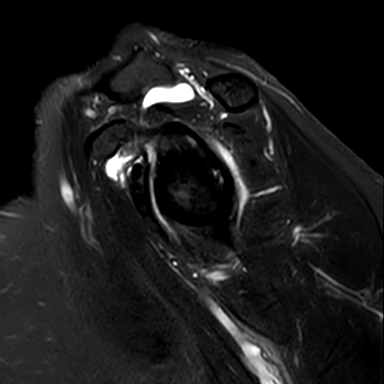
[im 21/30]
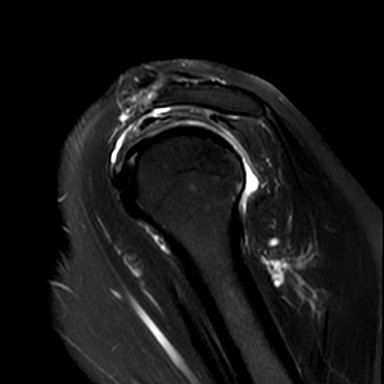
[im 25/30]
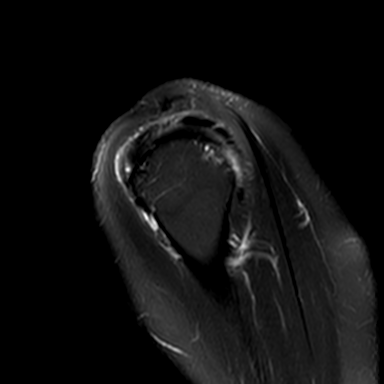
[im 30/30]
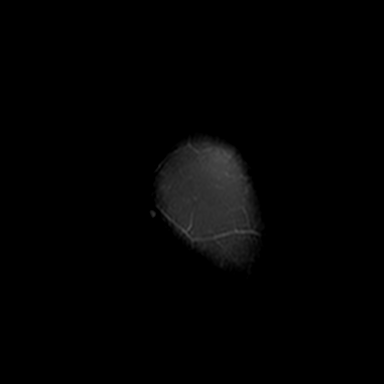

[Series 601: pd_fs_cor right · oblique · right · 3.5mm · 0.40mm/px · 6 of 24 slices shown]
[im 1/24]
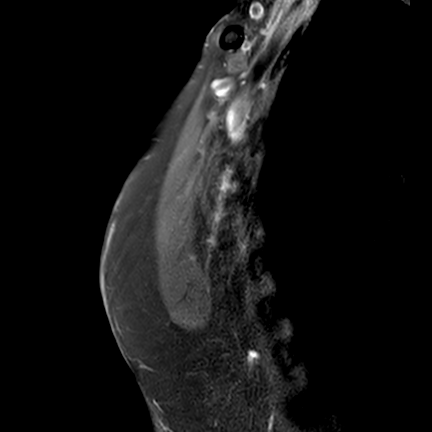
[im 5/24]
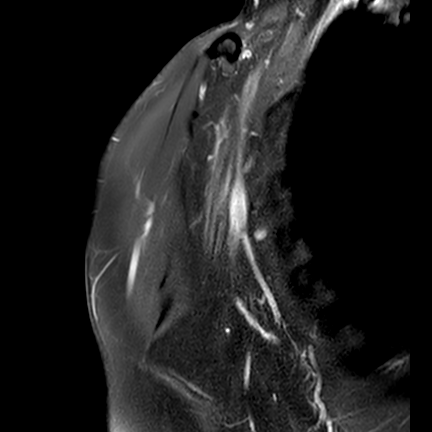
[im 10/24]
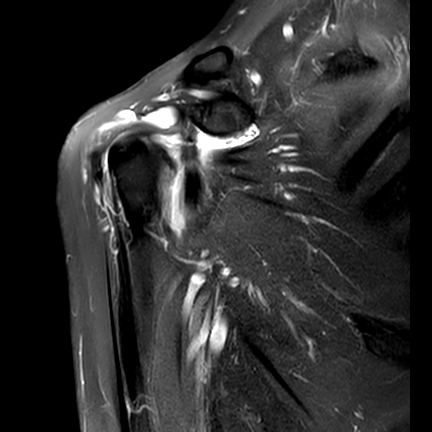
[im 14/24]
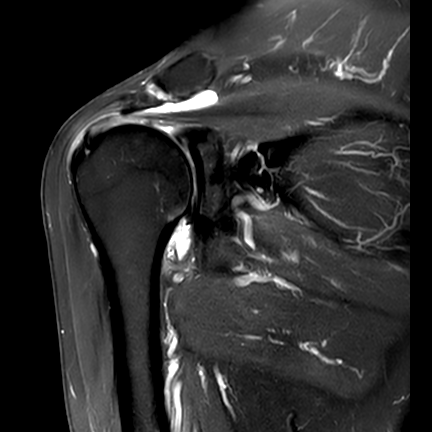
[im 19/24]
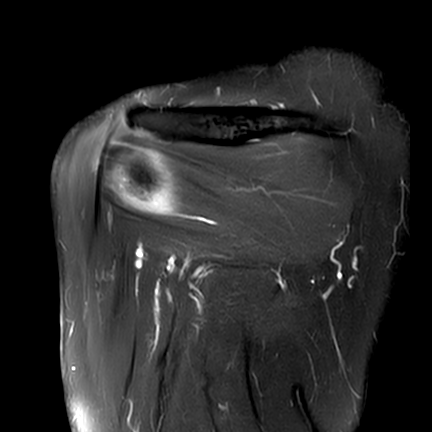
[im 24/24]
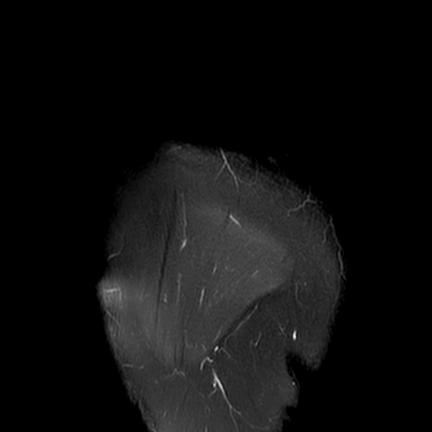

[Series 701: t1_sag right · oblique · right · 3.5mm · 0.31mm/px · 5 of 30 slices shown]
[im 1/30]
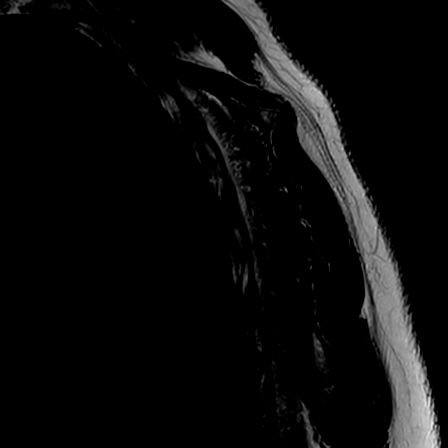
[im 5/30]
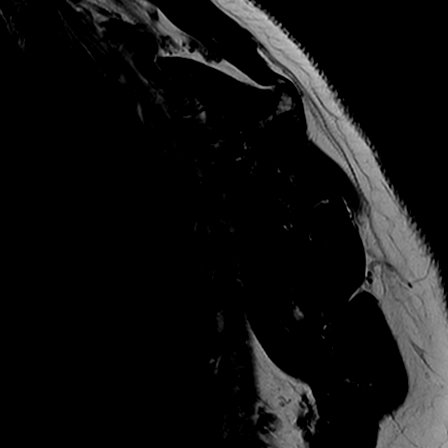
[im 9/30]
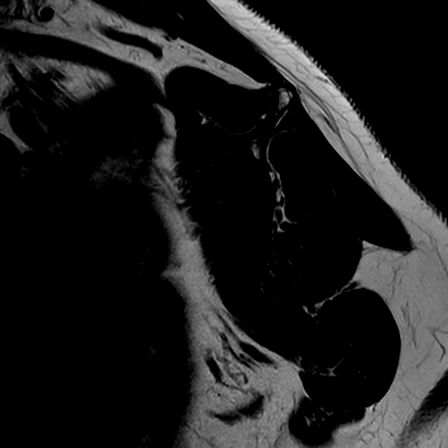
[im 13/30]
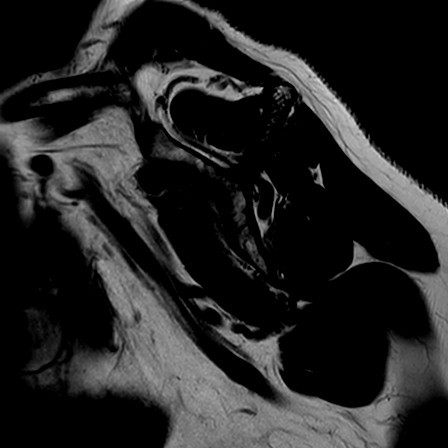
[im 17/30]
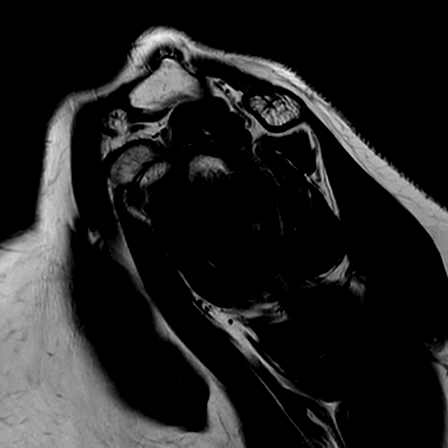

[31 of 40 positions shown; findings below may reference images not displayed]

FINDINGS: ROTATOR CUFF: There is signal abnormality with tendinosis of the supraspinatus 
and infraspinatus. There is low-grade concealed interstitial tearing of the mid 
supraspinatus insertion, series 601, image 13. The rotator cuff musculature is 
symmetric without mass, signal abnormality or atrophy. 
ACROMIOCLAVICULAR JOINT: Preserved. The coracoacromial ligament is intact 
without prominent spurring at the acromial attachment. The acromioclavicular and 
coracoclavicular ligaments are preserved. The acromium is normal in morphology. 
GLENOHUMERAL JOINT: The humeral head is well located within the glenoid fossa. 
Mild to moderate articular cartilaginous loss. The glenoid labrum is preserved. 
No paralabral cyst. The intra-articular portion of the long head of the biceps 
tendon is negative. Small shoulder joint effusion with synovitis. 
BONES: The bone marrow signal intensity is negative for fracture. No Hill-Sachs 
defect. Subcortical cystic change of the humeral head. 
ADDITIONAL FINDINGS: There is a small amount of fluid within the 
subacromial-subdeltoid space. The axillary region is negative. Subcutaneous 
tissues are negative.
IMPRESSION: 1.  Mild subacromial/subdeltoid bursitis. 
2.  Tendinosis with low-grade concealed interstitial tearing of the insertion of 
the supraspinatus. 
3.  Mild degenerative changes of the glenohumeral articulation with small 
effusion with synovitis.

## 2024-04-07 NOTE — Progress Notes
 MEMORY CARE CLINIC EVALUATION    Chief Complaint:  Deborah Johns is a 73 y.o. old female here for a follow-up cognitive evaluation.   Additional history during the visit provided by:  Patient and partner Deborah Johns  Onset:  ~2017.  Course:  progressive    HPI:  Since last visit on 10/08/23, with myself her cognition has remained stable. Deborah Johns notices some repetition and lapses in memory. She does well with time relationships and keeping up her calendar and notes. Continues to manage her medications and finances. No trouble with ADLs or driving.She is sleeping well and good mood overall.     Deborah Johns continues to live alone but she and Deborah Johns spend most of their time together. Deborah Johns's wife had early onset AD and passed in 2020 at age 13. Deborah Johns also had a previous husband who passed. They have been partners since 2021 after his wife's passing/leukemia diagnosis but have known each other for many years prior.    Deborah Johns reports that her daughter who lives in [place] is due to have her baby boy next month and she will be going there to spend some time with them.     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    Function  Living situation:  [place]   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 Sleep has improved after her aching has resolved after switching cancer medication to Tamoxifen, and taking Donepezil in the morning. Not needing melatonin at this time but has available if needed.   B/B Incontinence: No  Other: Sees Dr. Ruven Johns for gynecology, due to fam hx of cervical cancer. She reports that she had a hysterectomy in December 2022.    Family Hx: Father had memory issues, but no official diagnosis before his passing due to heart issues. Paternal grandmother (73-90s), paternal great grandmother, paternal uncle with dementia.    Socia Hx: Retired Runner, broadcasting/film/video. Widow. One daughter in [place] who is supportive.     Past Medical History:    Disorganized thinking    Hyperlipidemia    Memory change       Family History   Problem Relation Name Age of Onset    Dementia Father      Dementia Paternal Grandmother      Dementia Paternal Uncle         Social History     Socioeconomic History    Marital status: Widowed   Tobacco Use    Smoking status: Never     Passive exposure: Past (Parents and grandparents)    Smokeless tobacco: Never   Vaping Use    Vaping status: Never Used   Substance and Sexual Activity  Alcohol use: Not Currently    Drug use: Never   Social History Narrative    Retired Optometrist Burden and Social Support:  Deborah Johns does not rely on care giving. Daughter is supportive. Lives out of state in [place].   Care partner burden expressed? No    Safety Assessment:  Driving? Yes     Disorientation when driving? No     Recent traffic violations or accidents? No     Passenger concerns? No  Safety concerns in home? No   Wandering? No    Advanced Care Planning  DPOA:  Yes daughter  Advance Directive: Yes    Medications:   CHOLEcalciferoL (vitamin D3) 1,000 units tablet Take one-half tablet by mouth daily with breakfast. Takes full tablet    denosumab (PROLIA) 60 mg/mL injection Inject 1 mL under the skin every 168 days.    desloratadine (CLARINEX) 5 mg tablet Take one tablet by mouth daily with breakfast.    docosahexaenoic acid/epa (FISH OIL PO) Take 1,200 mg by mouth daily with breakfast.    donepeziL (ARICEPT) 10 mg tablet Take 1 tablet by mouth once daily    ergocalciferol (vitamin D2) (VITAMIN D PO) Take 2,000 mg by mouth daily.    Glucosamine-Chondroitin 250-200 mg tab Take 1 tablet by mouth daily with breakfast. Osteo Bi-Flex    MAGNESIUM PO Take 500 mg by mouth daily with breakfast.    multivit with calcium,iron,min (WOMEN'S MULTIPLE VITAMINS PO) Take 1 tablet by mouth daily with breakfast. Vitafusion Women's MVI gummies    naproxen sodium (ALEVE) 220 mg tablet Take one tablet by mouth daily.    OMEGA 3-DHA-EPA-FISH OIL PO Take 1 capsule by mouth daily with breakfast. Not sure of strength as otc    other medication one Dose. Vitafusion calcium gummies Calcium - Cholecalciferol 600 mg - 10 mcg (400 unit) Takes 2 tablets with breakfast    other medication one Dose. Vitamin B12 Patient takes 1 tablet daily and is unsure of strength as otc    other medication one Dose. Vitamin B3 Patient takes 1 tablet daily and is unsure of strength as otc    other medication one Dose. Osteobiflex Takes 1 tablet daily Patient unure of strength as otc    tamoxifen (NOLVADEX) 20 mg tablet Take one tablet by mouth at bedtime daily.    tretinoin (RETIN-A) 0.025 % topical cream Apply  topically to affected area at bedtime daily.    vit C/E/zinc ox/copp/lut/zeax (ICAPS AREDS2 PO) Take 1 capsule by mouth daily with breakfast. (Vitamin C, E, Zn-Copper - Lutein - Zeaxan 250 mg-90 mg-40 mg 1 mg)    vitamin E acetate (VITAMIN E PO) Take 2,000 mg by mouth daily.     Medication Review:   I have personally reviewed the medication list and am making recommendations as listed in the plan below.   Liddy manages medication at home for [employer].   The following medications have been identified as potential high risk medications: none    Physical/Cognitive Exam  BP 104/69 (BP Source: Arm, Left Upper, Patient Position: Sitting)  - Pulse 67  - Ht 162.6 cm (5' 4)  - Wt 60.9 kg (134 lb 3.2 oz)  - LMP  (LMP Unknown)  - SpO2 94%  - BMI 23.04 kg/m?   BP Readings from Last 3 Encounters:   10/08/23 126/79   04/04/23 118/79   10/02/22 121/80     Wt Readings from Last 6 Encounters:  10/08/23 60.6 kg (133 lb 9.6 oz)   04/04/23 61.9 kg (136 lb 6.4 oz)   10/02/22 62 kg (136 lb 9.6 oz)   05/21/22 62.2 kg (137 lb 3.2 oz)   04/13/21 60.7 kg (133 lb 12.8 oz)   12/22/20 63 kg (139 lb)     General  Groomed with appropriate hygiene. Dress appropriate for season/situation.    HEENT  NC/AT    Cardio/Pulm  Non labored at rest, good effort  RR to radial palpation    Mental Status  Alert, scored 8/8 for orientation on STMS  Language fluent    Cranial Nerves  Extraocular movements intact  Pupils equal and reactive to light  No facial asymmetry  Shoulder shrug symmetrical  Tongue and palate midline  All other CNs normal    Motor  Full strength in the upper and lower extremities  Rigidity: none  Bradykinesia: none  Tremor: none    Gait  Rising from chair: Normal - without pushing off  Posture: Normal  Gait initiation: Normal  Stride Length: Normal  Arm Swing: Normal  Turns: Normal    Psych  Affect and mood congruent and appropriate to situation  Thought process is logical    Neurobehavioral Testing:    12/2020 STMS 35/38   04/2021 STMS 36/38  04/2022 STMS 33/38   09/2022 STMS 35/38   03/2023 STMS 34/38   09/2023 STMS 33/38     03/2024 Short Test of Mental Status  (Name, address, current location, current city, current state, year, month, day of the week or date)  Orientation: 8/8    (8-1-9-5, 2-5-7-3)  (2-9-6-8-3, 5-8-3-0-9)  (5-7-1-9-4-6, 1-6-3-7-2-5)   (2-1-5-9-3-6-2, 4-9-6-8-3-1-5)  Attention: 7/7    Immediate Recall: 4/4 (number of trials: 1, -0)    5 x 13; 65 - 7; 58/2; 29 + 11  Calculations: 4/4    Similarities: orange/banana, dog/horse, table/bookcase  Abstraction: 2/3    table/bookcase - wooden, something to put things on    Cube: 2/2    Clock: 2/2    Information: 4/4    Delayed Recall: 0/4  Total Score: 33/38       Labs/Imaging:  No results found for: TSH  No results found for: VITB12     MRI Brain (09/11/2020) reviewed by Dr. Michaelene Admire  Brain MRI reviewed. I would note there is bilateral hippocampal atrophy, in addition the main findings of:  1.  Mild general cerebral volume loss, likely age-appropriate. No obvious pattern specific volume loss.  2.  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 Subtle manifestations of amyloid angiopathy could also be present.     The small hemosiderin depositions could raise a concern for vascular amyloid. The next step will be to review the results of her LP.     CSF results 09/2020:  AB42 589 (reduced)  Ttau 208 (normal)  PTau 18.4 (normal)  Ttau/AB42 ratio 0.031 (increased)     Dr. Cathlean Co Interpretation:  Results c/w evolving AD vs potentially d/o of fluid dynamics. MRI does not show evidence of altered flow problem and neither does exam.     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    Diagnosis and Plan       1. Mild cognitive impairment            Work up including MRI and CSF consistent with MCI with emerging AD  pathology based on LP results. STMS today 33/38, stable. Patient is independent with all ADLS. She is on Donepezil 10mg  daily in the morning.     FAST score: 3. Mild Cognitive Impairment-Objective Functional deficit interferes with a per's most complex tasks    Decision Making  Patient does not appear to have remarkable difficulties with their medical decision-making capacity.    CARE PLAN  Further Evaluation  No new recommendations  Yearly B12 and TSH checks with PCP recommended     Cognitive therapy plan  Continue Aricept (Donepezil) from 10 mg daily in the morning with food.     No concerns for sleep apnea. You can use melatonin ER as needed if you feel the need for this again in the future.     Neuropsychiatric therapy  No medications needed at this time    Lifestyle Recommendations  Encouraged to stay active mentally, physically, and socially (reading, exercising, visiting with friends and family)  Encouraged heart healthy diet  Driving: No limitations    We would advise you to limit alcohol intake to 1 standard alcoholic beverage/day or less.  My Alliance: [email or web address].[email or web address]    Referrals  [facility]:  Deferred today, has adequate support   Research:  Previously referred. She is interested in the future, but was told she was not eligible based on cancer management.   General educational materials provided on lifestyle interventions for memory care    Follow-up  Return to clinic in 6 months.    How to Keep Your Brain Healthy   Physical exercise: Physical exercise allows more blood flow to get to your brain.  All exercise is better than none, but gardening and walking are like tier 1 of exercise and will not get your heart rate higher than 100 in many instances. Weight training, yoga, and resistance bands are like tier 2 level. We see the real benefits at tier 3 level of exercise, which involves aerobic or cardiovascular exercise. Cardio exercise involves getting your heart rate up and a real sweat going.  This level of exercise can release BDNF (brain derived neurotrophic factor) which helps stabilize neurons - we think of it as fertilizer for those brain cells.  Research studies suggest symptoms progress slower and brain atrophy slows in cardio intensive exercise groups.  This can be done with high intensity workouts, running, biking, stationary biking, rowing machine, or in a swimming pool.  Talk to your health care provider before doing strenuous exercise. The ultimate goal is to get 150 minutes of moderate to intense exercise each week, most easily divided up into 30 minutes per day 5 days per week. It is recommended that one eases into this and slowly increases their amount of exercise over weeks to months. If you have never exercised before or are in poor exercise shape, it is recommended that you seek a personal trainer or join a local gym with programs directed for your age/exercise level.    Diet: Eating a heart-healthy diet may help protect brain function. Further information about the Mediterranean/MIND diet is listed below.  Talk to your primary medical provider about your specific diet needs.    Sleep: The toxic proteins that we all build up during the day and are associated with neurodegeneration of neurons are likely only cleared during deep, slow wave stages of sleep. If deep sleep is disrupted, fewer proteins are cleared. Sleep is also important for memory consolidation and restorative aspects to help with attention of tasks throughout the next day.  The average person requires 7-9 hours of quality sleep per night.  Try to maintain a sleep routine.  Avoid caffeine in the afternoon and heavy meals after 7 pm.  Talk to your local medical provider if you are having problems with sleeping (for example: insomnia, obstructive sleep apnea, excessive daytime sleepiness, or not sleeping enough).    Mental exercise: Push your brain with new things and new places.  Take a community education class, or attend a lecture at [employer]. Doing activities, such as reading, learning an instrument or a new language is healthy for our brains. Utilize cognitive training apps or programs. There is no evidence currently that one works better than other, but make sure not to focus on one thing (crosswords, Sudoku puzzles, computer-based games, etc.)  Make sure to have a broad cognitive program.    Stress Reduction: Simplify your life as much as possible. Meditate, do yoga or tai chi, or perform deep breathing, etc.  Stress will amplify any underlying problem.      Spiritual Fitness: Find something you are passionate about and that gives your life meaning and purpose each day.  Get involved.  Stay active in your local community and with social functions.    Getting involved in research: This can be empowering for many who are involved in research. It gives individuals a sense of purpose and fighting back against this disease. The first person who will be cured of a neurodegenerative disease will be from a research study and helping us  move that science forward is a major contribution.    Mediterranean Diet   The Mediterranean Diet and the MIND diet are the best studied across neurodegenerative diseases.  Both have shown a reduced risk of Alzheimer's disease across multiple studies, ranging from 35-50% reduced risk.  The Mediterranean diet is considered to be a heart-healthy diet as well.  Interested in trying the Mediterranean diet? These tips will help you get started:  Eat more fruits and vegetables. Aim for 7 to 10 servings a day of fruit and vegetables.  Opt for whole grains. Switch to whole-grain bread, cereal and pasta. Experiment with other whole grains, such as bulgur and farro.  Use healthy fats. Try olive oil as a replacement for butter when cooking. Instead of putting butter or margarine on bread, try dipping it in flavored olive oil.  Eat more seafood. Eat fish twice a week. Fresh or water-packed tuna, salmon, trout, mackerel and herring are healthy choices. Grilled fish tastes good and requires little cleanup. Avoid deep-fried fish.  Reduce red meat. Substitute fish, poultry or beans for meat. If you eat meat, make sure it's lean and keep portions small.  Enjoy some dairy. Eat low-fat [place] or plain yogurt and small amounts of a variety of cheeses.  Spice it up. Herbs and spices boost flavor and lessen the need for salt.    Utilize a high rated book on the subject or a good Solicitor as well: https://www.helpguide.org/articles/diets/the-mediterranean-diet.htm    Other important information:  Scheduling: [phone]   Kynli Chou or our nurses: [phone]  I am always available via mychart. Please feel free to send me any questions you might have, anytime.  After hours, you can call [phone], and have the neurology resident on call paged    Total of 45 minutes were spent on the same day of the visit including preparing to see the patient, obtaining and/or reviewing separately obtained history, performing a medically appropriate examination and/or evaluation, counseling and educating the  patient/family/caregiver, ordering medications, tests, or procedures, referring and communication with other health care professionals, documenting clinical information in the electronic or other health record, independently interpreting results and communicating results to the patient/family/caregiver, and care coordination.

## 2024-04-08 ENCOUNTER — Encounter: Admit: 2024-04-08 | Discharge: 2024-04-08 | Payer: MEDICARE

## 2024-04-08 DIAGNOSIS — G3184 Mild cognitive impairment, so stated: Secondary | ICD-10-CM

## 2024-04-08 NOTE — Patient Instructions
 urther Evaluation  No new recommendations  Yearly B12 and TSH checks with PCP    Cognitive therapy plan  Continue Aricept (Donepezil) from 10 mg daily in the morning with food.    No concerns for sleep apnea. You can use melatonin ER as needed if you feel the need for this again in the future.     Neuropsychiatric therapy  No medications needed at this time    Lifestyle Recommendations  Encouraged to stay active mentally, physically, and socially (reading, exercising, visiting with friends and family)  Encouraged heart healthy diet  Driving: No limitations    We would advise you to limit alcohol intake to 1 standard alcoholic beverage/day or less.  My Alliance: [email or web address]    Referrals  Cognitive Care Network:  Deferred today, has adequate support   Research:  Previously referred. She is interested in the future, but was told she was not eligible based on cancer management.   General educational materials provided on lifestyle interventions for memory care    Follow-up  Return to clinic in 6 months.    How to Keep Your Brain Healthy   Physical exercise: Physical exercise allows more blood flow to get to your brain.  All exercise is better than none, but gardening and walking are like tier 1 of exercise and will not get your heart rate higher than 100 in many instances. Weight training, yoga, and resistance bands are like tier 2 level. We see the real benefits at tier 3 level of exercise, which involves aerobic or cardiovascular exercise. Cardio exercise involves getting your heart rate up and a real sweat going.  This level of exercise can release BDNF (brain derived neurotrophic factor) which helps stabilize neurons - we think of it as fertilizer for those brain cells.  Research studies suggest symptoms progress slower and brain atrophy slows in cardio intensive exercise groups.  This can be done with high intensity workouts, running, biking, stationary biking, rowing machine, or in a swimming pool.  Talk to your health care provider before doing strenuous exercise. The ultimate goal is to get 150 minutes of moderate to intense exercise each week, most easily divided up into 30 minutes per day 5 days per week. It is recommended that one eases into this and slowly increases their amount of exercise over weeks to months. If you have never exercised before or are in poor exercise shape, it is recommended that you seek a personal trainer or join a local gym with programs directed for your age/exercise level.    Diet: Eating a heart-healthy diet may help protect brain function. Further information about the Mediterranean/MIND diet is listed below.  Talk to your primary medical provider about your specific diet needs.    Sleep: The toxic proteins that we all build up during the day and are associated with neurodegeneration of neurons are likely only cleared during deep, slow wave stages of sleep. If deep sleep is disrupted, fewer proteins are cleared. Sleep is also important for memory consolidation and restorative aspects to help with attention of tasks throughout the next day.  The average person requires 7-9 hours of quality sleep per night.  Try to maintain a sleep routine.  Avoid caffeine in the afternoon and heavy meals after 7 pm.  Talk to your local medical provider if you are having problems with sleeping (for example: insomnia, obstructive sleep apnea, excessive daytime sleepiness, or not sleeping enough).    Mental exercise: Push your brain with new things and new  places.  Take a community education class, or attend a lecture at [employer]. Doing activities, such as reading, learning an instrument or a new language is healthy for our brains. Utilize cognitive training apps or programs. There is no evidence currently that one works better than other, but make sure not to focus on one thing (crosswords, Sudoku puzzles, computer-based games, etc.)  Make sure to have a broad cognitive program.    Stress Reduction: Simplify your life as much as possible. Meditate, do yoga or tai chi, or perform deep breathing, etc.  Stress will amplify any underlying problem.      Spiritual Fitness: Find something you are passionate about and that gives your life meaning and purpose each day.  Get involved.  Stay active in your local community and with social functions.    Getting involved in research: This can be empowering for many who are involved in research. It gives individuals a sense of purpose and fighting back against this disease. The first person who will be cured of a neurodegenerative disease will be from a research study and helping us  move that science forward is a major contribution.    Mediterranean Diet   The Mediterranean Diet and the MIND diet are the best studied across neurodegenerative diseases.  Both have shown a reduced risk of Alzheimer's disease across multiple studies, ranging from 35-50% reduced risk.  The Mediterranean diet is considered to be a heart-healthy diet as well.  Interested in trying the Mediterranean diet? These tips will help you get started:  Eat more fruits and vegetables. Aim for 7 to 10 servings a day of fruit and vegetables.  Opt for whole grains. Switch to whole-grain bread, cereal and pasta. Experiment with other whole grains, such as bulgur and farro.  Use healthy fats. Try olive oil as a replacement for butter when cooking. Instead of putting butter or margarine on bread, try dipping it in flavored olive oil.  Eat more seafood. Eat fish twice a week. Fresh or water-packed tuna, salmon, trout, mackerel and herring are healthy choices. Grilled fish tastes good and requires little cleanup. Avoid deep-fried fish.  Reduce red meat. Substitute fish, poultry or beans for meat. If you eat meat, make sure it's lean and keep portions small.  Enjoy some dairy. Eat low-fat [place] or plain yogurt and small amounts of a variety of cheeses.  Spice it up. Herbs and spices boost flavor and lessen the need for salt.    Utilize a high rated book on the subject or a good Solicitor as well: https://www.helpguide.org/articles/diets/the-mediterranean-diet.htm

## 2024-04-30 ENCOUNTER — Encounter: Admit: 2024-04-30 | Discharge: 2024-04-30 | Payer: MEDICARE

## 2024-04-30 MED ORDER — DONEPEZIL 10 MG PO TAB
10 mg | ORAL_TABLET | Freq: Every day | ORAL | 3 refills | 90.00000 days | Status: AC
Start: 2024-04-30 — End: ?

## 2024-04-30 NOTE — Telephone Encounter
 LV was 10/08/23 and next visit is 10/21/2024.  Per care plan: continue donepezil 10mg  daily.

## 2024-10-16 NOTE — Progress Notes [1]
 MEMORY CARE CLINIC EVALUATION    Chief Complaint:  Deborah Johns is a 73 y.o. old female here for a follow-up cognitive evaluation.   Additional history during the visit provided by:  Patient presents alone  Onset: ~2017  Course: progressive    HPI:  Since last visit on 04/08/24 with myself her cognition has remained stable. She does well with time relationships and keeping up her calendar and notes. Continues to manage her medications and finances. No trouble with ADLs or driving.She is sleeping well and good mood overall.     Jamaria continues to live alone but she and friend Alm spend most of their time together. David's wife had early onset AD and passed in 2020. Corleen also had a previous husband who passed. They have been partners since 2021 after his wife's passing/leukemia diagnosis but have known each other for many years prior. Alm helps with meals and Manessa helps him with various items as well    Gilberto reports that her daughter who lives in [place] welcomed her baby boy so she spent some time there with her.     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    Function  Living situation:  [place]   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 Sleep has improved after her aching has resolved after switching cancer medication to Tamoxifen, and taking Donepezil  in the morning. Not needing melatonin at this time but has available if needed.   B/B Incontinence: No  Other: Sees Dr. Jearline for gynecology, due to fam hx of cervical cancer. She reports that she had a hysterectomy in December 2022.    Family Hx: Father had memory issues, but no official diagnosis before his passing due to heart issues. Paternal grandmother (53-90s), paternal great grandmother, paternal uncle with dementia.    Socia Hx: Retired runner, broadcasting/film/video. Widow. One daughter in [place] who is supportive.     Past Medical History:    Disorganized thinking    Hyperlipidemia    Memory change       Family History   Problem Relation Name Age of Onset    Dementia Father      Dementia Paternal Grandmother      Dementia Paternal Uncle         Social History     Socioeconomic History    Marital status: Widowed   Tobacco Use    Smoking status: Never     Passive exposure: Past (Parents and grandparents)    Smokeless tobacco: Never   Vaping Use    Vaping status: Never Used   Substance and Sexual Activity    Alcohol use: Not Currently  Drug use: Never   Social History Narrative    Retired Optometrist Burden and Social Support:  Aleen Marston does not rely on care giving. Daughter is supportive. Lives out of state in [place].   Care partner burden expressed? No    Safety Assessment:  Driving? Yes     Disorientation when driving? No     Recent traffic violations or accidents? No     Passenger concerns? No  Safety concerns in home? No   Wandering? No    Advanced Care Planning  DPOA:  Yes daughter  Advance Directive: Yes    Medications:   CHOLEcalciferoL (vitamin D3) 1,000 units tablet Take one-half tablet by mouth daily with breakfast. Takes full tablet    denosumab (PROLIA) 60 mg/mL injection Inject 1 mL under the skin every 168 days.    desloratadine (CLARINEX) 5 mg tablet Take one tablet by mouth daily with breakfast.    docosahexaenoic acid/epa (FISH OIL PO) Take 1,200 mg by mouth daily with breakfast.    donepeziL  (ARICEPT ) 10 mg tablet Take 1 tablet by mouth once daily    ergocalciferol (vitamin D2) (VITAMIN D PO) Take 2,000 mg by mouth daily.    Glucosamine-Chondroitin 250-200 mg tab Take 1 tablet by mouth daily with breakfast. Osteo Bi-Flex    MAGNESIUM PO Take 500 mg by mouth daily with breakfast.    multivit with calcium,iron,min (WOMEN'S MULTIPLE VITAMINS PO) Take 1 tablet by mouth daily with breakfast. Vitafusion Women's MVI gummies    naproxen sodium (ALEVE) 220 mg tablet Take one tablet by mouth daily.    OMEGA 3-DHA-EPA-FISH OIL PO Take 1 capsule by mouth daily with breakfast. Not sure of strength as otc    other medication one Dose. Vitafusion calcium gummies Calcium - Cholecalciferol 600 mg - 10 mcg (400 unit) Takes 2 tablets with breakfast (Patient taking differently: Take two Doses by mouth daily. Vitafusion calcium gummies Calcium - Cholecalciferol 600 mg - 10 mcg (400 unit) Takes 2 tablets with breakfast)    other medication one Dose. Vitamin B12 Patient takes 1 tablet daily and is unsure of strength as otc (Patient taking differently: Take one Dose by mouth daily. Vitamin B12 Patient takes 1 tablet daily and is unsure of strength as otc)    other medication one Dose. Vitamin B3 Patient takes 1 tablet daily and is unsure of strength as otc (Patient taking differently: Take one Dose by mouth daily. Vitamin B3 Patient takes 1 tablet daily and is unsure of strength as otc)    other medication one Dose. Osteobiflex Takes 1 tablet daily Patient unure of strength as otc (Patient taking differently: Take two Doses by mouth daily. Osteobiflex Takes 1 tablet daily Patient unure of strength as otc)    tamoxifen (NOLVADEX) 20 mg tablet Take one tablet by mouth at bedtime daily.    tretinoin (RETIN-A) 0.025 % topical cream Apply topically to affected area at bedtime daily.    vit C/E/zinc ox/copp/lut/zeax (ICAPS AREDS2 PO) Take 1 capsule by mouth daily with breakfast. (Vitamin C, E, Zn-Copper - Lutein - Zeaxan 250 mg-90 mg-40 mg 1 mg)    vitamin E acetate (VITAMIN E PO) Take 2,000 mg by mouth daily.     Medication Review:   I have personally reviewed the medication list and am making recommendations as listed in the plan below.   Saddie manages medication at home for [employer].   The following medications have been identified as potential  high risk medications: none    Physical/Cognitive Exam  BP 119/71  - Pulse 71  - Wt 60.9 kg (134 lb 3.2 oz)  - LMP  (LMP Unknown)  - BMI 23.04 kg/m?   BP Readings from Last 3 Encounters:   04/08/24 104/69   10/08/23 126/79   04/04/23 118/79     Wt Readings from Last 6 Encounters:   04/08/24 60.9 kg (134 lb 3.2 oz)   10/08/23 60.6 kg (133 lb 9.6 oz)   04/04/23 61.9 kg (136 lb 6.4 oz)   10/02/22 62 kg (136 lb 9.6 oz)   05/21/22 62.2 kg (137 lb 3.2 oz)   04/13/21 60.7 kg (133 lb 12.8 oz)     General  Groomed with appropriate hygiene. Dress appropriate for season/situation.    HEENT  NC/AT    Cardio/Pulm  Non labored at rest, good effort  RR to radial palpation    Mental Status  Alert, scored 8/8 for orientation on STMS  Language fluent    Cranial Nerves  Extraocular movements intact  Pupils equal and reactive to light  No facial asymmetry  Shoulder shrug symmetrical  Tongue and palate midline  All other CNs normal    Motor  Full strength in the upper and lower extremities  Rigidity: none  Bradykinesia: none  Tremor: none    Gait  Rising from chair: Normal - without pushing off  Posture: Normal  Gait initiation: Normal  Stride Length: Normal  Arm Swing: Normal  Turns: Normal    Psych  Affect and mood congruent and appropriate to situation  Thought process is logical    Neurobehavioral Testing:    12/2020 STMS 35/38   04/2021 STMS 36/38  04/2022 STMS 33/38   09/2022 STMS 35/38   03/2023 STMS 34/38 09/2023 STMS 33/38   03/2024 STMS 33/38     10/2024 Short Test of Mental Status  (Name, address, current location, current city, current state, year, month, day of the week or date)  Orientation: 8/8    (8-1-9-5, 2-5-7-3)  (2-9-6-8-3, 5-8-3-0-9)  (5-7-1-9-4-6, 1-6-3-7-2-5)   (2-1-5-9-3-6-2, 4-9-6-8-3-1-5)  Attention: 7/7    Immediate Recall: 4/4 (number of trials: 1, -0)    5 x 13; 65 - 7; 58/2; 29 + 11  Calculations: 2/4    Similarities: orange/banana, dog/horse, table/bookcase  Abstraction: 2/3    Cube: 2/2    Clock: 2/2    Information: 3/4    Delayed Recall: 0/4  Total Score: 30/38      Labs/Imaging:  No results found for: TSH  No results found for: VITB12     MRI Brain (09/11/2020) reviewed by Dr. Thelma  Brain MRI reviewed. I would note there is bilateral hippocampal atrophy, in addition the main findings of:  1.  Mild general cerebral volume loss, likely age-appropriate. No obvious pattern specific volume loss.  2.  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 Subtle manifestations of amyloid angiopathy could also be present.     The small hemosiderin depositions could raise a concern for vascular amyloid. The next step will be to review the results of her LP.     CSF results 09/2020:  AB42 589 (reduced)  Ttau 208 (normal)  PTau 18.4 (normal)  Ttau/AB42 ratio 0.031 (increased)     Dr. Otho Interpretation:  Results c/w evolving AD vs potentially d/o of fluid dynamics. MRI does not show evidence of altered flow problem and  neither does exam.     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    Diagnosis and Plan       1. Mild cognitive impairment  ALZHEIMER?S DISEASE EVALUATION, PLASMA (PTAU217)/AMYLOID EVALUATION        Work up including MRI and CSF consistent with MCI with potential emerging AD pathology based on LP results. STMS today 30/38. Remains in mild cognitive impairment ranges, and has remained largely stable over several years. Patient is independent with all ADLS. She is on Donepezil  10mg  daily in the morning.     Discussed blood biomarker testing for AD which I feel is appropriate given previous borderline LP results, which was four years ago at this time. Also discussed newer anti-amyloid therapies and further workup is needed to determine eligibility, but choosing to start with blood biomarkers.    FAST score: 3. Mild Cognitive Impairment-Objective Functional deficit interferes with a per's most complex tasks    Decision Making  Patient does not appear to have remarkable difficulties with their medical decision-making capacity.    CARE PLAN  Further Evaluation  Ptau 217 - collect on first floor labs  I will call or MyChart you with results     Cognitive therapy plan  Continue Aricept  (Donepezil ) from 10 mg daily in the morning with food.    No concerns for sleep apnea. You can use melatonin ER as needed if you feel the need for this again in the future.     Neuropsychiatric therapy  No medications needed at this time    Lifestyle Recommendations  Encouraged to stay active mentally, physically, and socially (reading, exercising, visiting with friends and family)  Encouraged heart healthy diet  Driving: No limitations    We would advise you to limit alcohol intake to 1 standard alcoholic beverage/day or less.  My Alliance: [email or web address]    Referrals  Cognitive Care Network:  Deferred today, has adequate support   Research:  Previously referred. Enrolled in [facility] today. She is interested in the future, but was told she was not eligible based on cancer management.   General educational materials provided on lifestyle interventions for memory care    Follow-up  Return to clinic in 6 months.    How to Keep Your Brain Healthy   Physical exercise: Physical exercise allows more blood flow to get to your brain.  All exercise is better than none, but gardening and walking are like tier 1 of exercise and will not get your heart rate higher than 100 in many instances. Weight training, yoga, and resistance bands are like tier 2 level. We see the real benefits at tier 3 level of exercise, which involves aerobic or cardiovascular exercise. Cardio exercise involves getting your heart rate up and a real sweat going.  This level of exercise can release BDNF (brain derived neurotrophic factor) which helps stabilize neurons - we think of it as fertilizer for those brain cells.  Research studies suggest symptoms progress slower and brain atrophy slows in cardio intensive exercise groups.  This can be done with high intensity workouts, running, biking, stationary biking, rowing machine, or in a swimming pool.  Talk to your health care provider before doing strenuous exercise. The ultimate goal is to get 150 minutes of moderate to intense exercise each week, most easily divided up into 30 minutes per day 5 days per week. It is recommended that one eases into this and slowly increases their amount  of exercise over weeks to months. If you have never exercised before or are in poor exercise shape, it is recommended that you seek a personal trainer or join a local gym with programs directed for your age/exercise level.    Diet: Eating a heart-healthy diet may help protect brain function. Further information about the Mediterranean/MIND diet is listed below.  Talk to your primary medical provider about your specific diet needs.    Sleep: The toxic proteins that we all build up during the day and are associated with neurodegeneration of neurons are likely only cleared during deep, slow wave stages of sleep. If deep sleep is disrupted, fewer proteins are cleared. Sleep is also important for memory consolidation and restorative aspects to help with attention of tasks throughout the next day.  The average person requires 7-9 hours of quality sleep per night.  Try to maintain a sleep routine.  Avoid caffeine in the afternoon and heavy meals after 7 pm.  Talk to your local medical provider if you are having problems with sleeping (for example: insomnia, obstructive sleep apnea, excessive daytime sleepiness, or not sleeping enough).    Mental exercise: Push your brain with new things and new places.  Take a community education class, or attend a lecture at [employer]. Doing activities, such as reading, learning an instrument or a new language is healthy for our brains. Utilize cognitive training apps or programs. There is no evidence currently that one works better than other, but make sure not to focus on one thing (crosswords, Sudoku puzzles, computer-based games, etc.)  Make sure to have a broad cognitive program.    Stress Reduction: Simplify your life as much as possible. Meditate, do yoga or tai chi, or perform deep breathing, etc.  Stress will amplify any underlying problem.      Spiritual Fitness: Find something you are passionate about and that gives your life meaning and purpose each day.  Get involved.  Stay active in your local community and with social functions.    Getting involved in research: This can be empowering for many who are involved in research. It gives individuals a sense of purpose and fighting back against this disease. The first person who will be cured of a neurodegenerative disease will be from a research study and helping us  move that science forward is a major contribution.    Mediterranean Diet   The Mediterranean Diet and the MIND diet are the best studied across neurodegenerative diseases.  Both have shown a reduced risk of Alzheimer's disease across multiple studies, ranging from 35-50% reduced risk.  The Mediterranean diet is considered to be a heart-healthy diet as well.  Interested in trying the Mediterranean diet? These tips will help you get started:  Eat more fruits and vegetables. Aim for 7 to 10 servings a day of fruit and vegetables.  Opt for whole grains. Switch to whole-grain bread, cereal and pasta. Experiment with other whole grains, such as bulgur and farro.  Use healthy fats. Try olive oil as a replacement for butter when cooking. Instead of putting butter or margarine on bread, try dipping it in flavored olive oil.  Eat more seafood. Eat fish twice a week. Fresh or water-packed tuna, salmon, trout, mackerel and herring are healthy choices. Grilled fish tastes good and requires little cleanup. Avoid deep-fried fish.  Reduce red meat. Substitute fish, poultry or beans for meat. If you eat meat, make sure it's lean and keep portions small.  Enjoy some dairy. Eat low-fat  [place] or plain yogurt and small amounts of a variety of cheeses.  Spice it up. Herbs and spices boost flavor and lessen the need for salt.    Utilize a high rated book on the subject or a good solicitor as well: https://www.helpguide.org/articles/diets/the-mediterranean-diet.htm    Other important information:  Scheduling: [phone]   Romaine Neville or our nurses: [phone]  I am always available via mychart. Please feel free to send me any questions you might have, anytime.  After hours, you can call [phone], and have the neurology resident on call paged    Total of 45 minutes were spent on the same day of the visit including preparing to see the patient, obtaining and/or reviewing separately obtained history, performing a medically appropriate examination and/or evaluation, counseling and educating the patient/family/caregiver, ordering medications, tests, or procedures, referring and communication with other health care professionals, documenting clinical information in the electronic or other health record, independently interpreting results and communicating results to the patient/family/caregiver, and care coordination.

## 2024-10-21 ENCOUNTER — Encounter: Admit: 2024-10-21 | Discharge: 2024-10-21 | Payer: MEDICARE

## 2024-10-21 ENCOUNTER — Ambulatory Visit: Admit: 2024-10-21 | Discharge: 2024-10-22 | Payer: MEDICARE

## 2024-10-21 ENCOUNTER — Ambulatory Visit: Admit: 2024-10-21 | Discharge: 2024-10-21 | Payer: MEDICARE

## 2024-10-21 VITALS — BP 119/71 | HR 71 | Wt 134.2 lb

## 2024-10-21 DIAGNOSIS — G3184 Mild cognitive impairment, so stated: Principal | ICD-10-CM

## 2024-10-21 NOTE — Patient Instructions [37]
 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    No concerns for sleep apnea. You can use melatonin ER as needed if you feel the need for this again in the future.     Neuropsychiatric therapy  No medications needed at this time    Lifestyle Recommendations  Encouraged to stay active mentally, physically, and socially (reading, exercising, visiting with friends and family)  Encouraged heart healthy diet  Driving: No limitations    We would advise you to limit alcohol intake to 1 standard alcoholic beverage/day or less.  [facility]: [email or web address]    Referrals  [facility]:  Deferred today, has adequate support   Research:  Previously referred. She is interested in the future, but was told she was not eligible based on cancer management.   General educational materials provided on lifestyle interventions for memory care    Follow-up  Return to clinic in 6 months.

## 2024-10-23 ENCOUNTER — Encounter: Admit: 2024-10-23 | Discharge: 2024-10-23 | Payer: MEDICARE

## 2024-10-23 NOTE — Telephone Encounter [36]
 Returned patient's call. 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 Cont Donepezil . Directed to [place] [facility] program for additional information

## 2024-10-23 NOTE — Telephone Encounter [36]
 LVM for patient regarding ptau results. MyChart message sent as well.     #1 AD Biomarker Results  We received the results of your AD biomarker blood test.   This test looks at three things:  A protein called ptau217  A protein called amyloid beta 42 (Abeta42)  The ratio, or comparison, of these two proteins  These proteins can give us  clues about what may be happening in the brain.    The test can result with 3 possibilities:  Normal / Not Elevated Ratio:  This usually means someone is not likely to have Alzheimer?s disease changes in their brain.  High / Elevated Ratio:  This means someone is likely to have Alzheimer?s disease changes in their brain.  A high result suggests there may be amyloid plaques in the brain, which are linked to Alzheimer?s disease.  When we look at these results, together with symptoms, the test can support a diagnosis of Alzheimer?s disease.  Borderline / Indeterminate Ratio:  This means the test is unclear, and we cannot tell for sure if Alzheimer?s changes are present.  In this case, we may recommend more testing, such as an amyloid PET scan or spinal fluid testing.    Your result showed PTau217:Abeta42 ratio value that was 0.04265 Elevated (Abnormal)    Important Note About Test Results:  No test is perfect. Sometimes results can be false.  Other medical issues, like kidney problems, can affect the numbers.  High results may need to be verified by using spinal fluid testing or an amyloid PET scan.    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    You are already on the correct medication, which is Donepezil  10 mg.

## 2024-10-26 ENCOUNTER — Encounter: Admit: 2024-10-26 | Discharge: 2024-10-26 | Payer: MEDICARE

## 2024-10-26 NOTE — Progress Notes [1]
 Mailed ptau results to the pt's home address.
